# Patient Record
Sex: Female | Born: 1950 | Race: White | Hispanic: No | Marital: Married | Smoking: Former smoker
Health system: Southern US, Community
[De-identification: ages and names within clinical notes are randomized; demographics above are authoritative.]

## PROBLEM LIST (undated history)

## (undated) DIAGNOSIS — M549 Dorsalgia, unspecified: Secondary | ICD-10-CM

## (undated) DIAGNOSIS — M5090 Cervical disc disorder, unspecified, unspecified cervical region: Secondary | ICD-10-CM

## (undated) DIAGNOSIS — E785 Hyperlipidemia, unspecified: Secondary | ICD-10-CM

## (undated) DIAGNOSIS — I509 Heart failure, unspecified: Secondary | ICD-10-CM

## (undated) DIAGNOSIS — K449 Diaphragmatic hernia without obstruction or gangrene: Secondary | ICD-10-CM

## (undated) DIAGNOSIS — K219 Gastro-esophageal reflux disease without esophagitis: Secondary | ICD-10-CM

## (undated) DIAGNOSIS — M810 Age-related osteoporosis without current pathological fracture: Secondary | ICD-10-CM

## (undated) DIAGNOSIS — C3490 Malignant neoplasm of unspecified part of unspecified bronchus or lung: Secondary | ICD-10-CM

## (undated) DIAGNOSIS — G35D Multiple sclerosis, unspecified: Secondary | ICD-10-CM

## (undated) DIAGNOSIS — C4491 Basal cell carcinoma of skin, unspecified: Secondary | ICD-10-CM

## (undated) DIAGNOSIS — G8929 Other chronic pain: Secondary | ICD-10-CM

## (undated) DIAGNOSIS — G35 Multiple sclerosis: Secondary | ICD-10-CM

## (undated) DIAGNOSIS — C801 Malignant (primary) neoplasm, unspecified: Secondary | ICD-10-CM

## (undated) DIAGNOSIS — I4891 Unspecified atrial fibrillation: Secondary | ICD-10-CM

## (undated) HISTORY — DX: Hyperlipidemia, unspecified: E78.5

## (undated) HISTORY — PX: TONSILLECTOMY: SUR1361

## (undated) HISTORY — DX: Gastro-esophageal reflux disease without esophagitis: K21.9

## (undated) HISTORY — DX: Cervical disc disorder, unspecified, unspecified cervical region: M50.90

## (undated) HISTORY — DX: Other chronic pain: G89.29

## (undated) HISTORY — DX: Multiple sclerosis, unspecified: G35.D

## (undated) HISTORY — DX: Malignant (primary) neoplasm, unspecified: C80.1

## (undated) HISTORY — DX: Malignant neoplasm of unspecified part of unspecified bronchus or lung: C34.90

## (undated) HISTORY — DX: Dorsalgia, unspecified: M54.9

## (undated) HISTORY — DX: Multiple sclerosis: G35

## (undated) HISTORY — DX: Basal cell carcinoma of skin, unspecified: C44.91

## (undated) HISTORY — DX: Age-related osteoporosis without current pathological fracture: M81.0

## (undated) HISTORY — DX: Diaphragmatic hernia without obstruction or gangrene: K44.9

## (undated) HISTORY — PX: BREAST ENHANCEMENT SURGERY: SHX7

---

## 1997-12-01 HISTORY — PX: OTHER SURGICAL HISTORY: SHX169

## 2002-10-06 ENCOUNTER — Encounter: Payer: Self-pay | Admitting: Family Medicine

## 2002-10-06 ENCOUNTER — Ambulatory Visit (HOSPITAL_COMMUNITY): Admission: RE | Admit: 2002-10-06 | Discharge: 2002-10-06 | Payer: Self-pay | Admitting: Family Medicine

## 2003-10-17 ENCOUNTER — Other Ambulatory Visit: Admission: RE | Admit: 2003-10-17 | Discharge: 2003-10-17 | Payer: Self-pay | Admitting: Dermatology

## 2003-11-06 ENCOUNTER — Ambulatory Visit (HOSPITAL_COMMUNITY): Admission: RE | Admit: 2003-11-06 | Discharge: 2003-11-06 | Payer: Self-pay | Admitting: Family Medicine

## 2004-07-01 ENCOUNTER — Encounter (HOSPITAL_COMMUNITY): Admission: RE | Admit: 2004-07-01 | Discharge: 2004-07-31 | Payer: Self-pay | Admitting: Orthopedic Surgery

## 2004-07-05 ENCOUNTER — Ambulatory Visit (HOSPITAL_COMMUNITY): Admission: RE | Admit: 2004-07-05 | Discharge: 2004-07-05 | Payer: Self-pay | Admitting: Orthopedic Surgery

## 2004-08-16 ENCOUNTER — Encounter (HOSPITAL_COMMUNITY): Admission: RE | Admit: 2004-08-16 | Discharge: 2004-08-30 | Payer: Self-pay | Admitting: Orthopedic Surgery

## 2005-10-09 ENCOUNTER — Ambulatory Visit (HOSPITAL_COMMUNITY): Admission: RE | Admit: 2005-10-09 | Discharge: 2005-10-09 | Payer: Self-pay | Admitting: Family Medicine

## 2005-10-30 ENCOUNTER — Ambulatory Visit (HOSPITAL_COMMUNITY): Admission: RE | Admit: 2005-10-30 | Discharge: 2005-10-30 | Payer: Self-pay | Admitting: Family Medicine

## 2006-12-01 DIAGNOSIS — C3490 Malignant neoplasm of unspecified part of unspecified bronchus or lung: Secondary | ICD-10-CM

## 2006-12-01 HISTORY — DX: Malignant neoplasm of unspecified part of unspecified bronchus or lung: C34.90

## 2006-12-11 ENCOUNTER — Ambulatory Visit (HOSPITAL_COMMUNITY): Admission: RE | Admit: 2006-12-11 | Discharge: 2006-12-11 | Payer: Self-pay | Admitting: Family Medicine

## 2006-12-21 ENCOUNTER — Ambulatory Visit (HOSPITAL_COMMUNITY): Admission: RE | Admit: 2006-12-21 | Discharge: 2006-12-21 | Payer: Self-pay | Admitting: *Deleted

## 2006-12-22 ENCOUNTER — Ambulatory Visit (HOSPITAL_COMMUNITY): Admission: RE | Admit: 2006-12-22 | Discharge: 2006-12-22 | Payer: Self-pay | Admitting: *Deleted

## 2007-09-15 ENCOUNTER — Ambulatory Visit (HOSPITAL_COMMUNITY): Admission: RE | Admit: 2007-09-15 | Discharge: 2007-09-15 | Payer: Self-pay | Admitting: Family Medicine

## 2007-09-29 ENCOUNTER — Ambulatory Visit (HOSPITAL_COMMUNITY): Admission: RE | Admit: 2007-09-29 | Discharge: 2007-09-29 | Payer: Self-pay | Admitting: Family Medicine

## 2007-10-12 ENCOUNTER — Ambulatory Visit: Payer: Self-pay | Admitting: Thoracic Surgery

## 2007-10-18 ENCOUNTER — Ambulatory Visit (HOSPITAL_COMMUNITY): Admission: RE | Admit: 2007-10-18 | Discharge: 2007-10-18 | Payer: Self-pay | Admitting: Thoracic Surgery

## 2007-10-20 ENCOUNTER — Ambulatory Visit: Payer: Self-pay | Admitting: Thoracic Surgery

## 2007-11-01 DIAGNOSIS — C801 Malignant (primary) neoplasm, unspecified: Secondary | ICD-10-CM

## 2007-11-01 HISTORY — PX: LUNG REMOVAL, PARTIAL: SHX233

## 2007-11-01 HISTORY — DX: Malignant (primary) neoplasm, unspecified: C80.1

## 2007-11-08 ENCOUNTER — Ambulatory Visit: Payer: Self-pay | Admitting: Thoracic Surgery

## 2007-11-08 ENCOUNTER — Encounter: Payer: Self-pay | Admitting: Thoracic Surgery

## 2007-11-08 ENCOUNTER — Inpatient Hospital Stay (HOSPITAL_COMMUNITY): Admission: RE | Admit: 2007-11-08 | Discharge: 2007-11-12 | Payer: Self-pay | Admitting: Thoracic Surgery

## 2007-11-18 ENCOUNTER — Ambulatory Visit: Payer: Self-pay | Admitting: Thoracic Surgery

## 2007-11-18 ENCOUNTER — Encounter: Admission: RE | Admit: 2007-11-18 | Discharge: 2007-11-18 | Payer: Self-pay | Admitting: Thoracic Surgery

## 2007-12-08 ENCOUNTER — Ambulatory Visit: Payer: Self-pay | Admitting: Thoracic Surgery

## 2007-12-08 ENCOUNTER — Encounter: Admission: RE | Admit: 2007-12-08 | Discharge: 2007-12-08 | Payer: Self-pay | Admitting: Thoracic Surgery

## 2007-12-22 ENCOUNTER — Encounter: Admission: RE | Admit: 2007-12-22 | Discharge: 2007-12-22 | Payer: Self-pay | Admitting: Thoracic Surgery

## 2007-12-22 ENCOUNTER — Ambulatory Visit: Payer: Self-pay | Admitting: Thoracic Surgery

## 2008-01-20 ENCOUNTER — Ambulatory Visit: Payer: Self-pay | Admitting: Thoracic Surgery

## 2008-01-20 ENCOUNTER — Encounter: Admission: RE | Admit: 2008-01-20 | Discharge: 2008-01-20 | Payer: Self-pay | Admitting: Thoracic Surgery

## 2008-04-04 ENCOUNTER — Ambulatory Visit (HOSPITAL_COMMUNITY): Payer: Self-pay | Admitting: Oncology

## 2008-04-11 ENCOUNTER — Ambulatory Visit: Payer: Self-pay | Admitting: Thoracic Surgery

## 2008-04-11 ENCOUNTER — Encounter: Admission: RE | Admit: 2008-04-11 | Discharge: 2008-04-11 | Payer: Self-pay | Admitting: Thoracic Surgery

## 2008-05-30 ENCOUNTER — Encounter (HOSPITAL_COMMUNITY): Admission: RE | Admit: 2008-05-30 | Discharge: 2008-06-29 | Payer: Self-pay | Admitting: Oncology

## 2008-06-14 ENCOUNTER — Ambulatory Visit: Payer: Self-pay | Admitting: Thoracic Surgery

## 2008-12-01 HISTORY — PX: COLONOSCOPY: SHX174

## 2008-12-21 ENCOUNTER — Ambulatory Visit: Payer: Self-pay | Admitting: Gastroenterology

## 2008-12-26 ENCOUNTER — Ambulatory Visit (HOSPITAL_COMMUNITY): Admission: RE | Admit: 2008-12-26 | Discharge: 2008-12-26 | Payer: Self-pay | Admitting: Gastroenterology

## 2008-12-26 ENCOUNTER — Encounter: Payer: Self-pay | Admitting: Gastroenterology

## 2008-12-26 ENCOUNTER — Ambulatory Visit: Payer: Self-pay | Admitting: Gastroenterology

## 2009-02-13 ENCOUNTER — Encounter: Admission: RE | Admit: 2009-02-13 | Discharge: 2009-02-13 | Payer: Self-pay | Admitting: Thoracic Surgery

## 2009-02-13 ENCOUNTER — Ambulatory Visit: Payer: Self-pay | Admitting: Thoracic Surgery

## 2009-03-08 DIAGNOSIS — Z9089 Acquired absence of other organs: Secondary | ICD-10-CM | POA: Insufficient documentation

## 2009-03-08 DIAGNOSIS — G8929 Other chronic pain: Secondary | ICD-10-CM | POA: Insufficient documentation

## 2009-03-08 DIAGNOSIS — N809 Endometriosis, unspecified: Secondary | ICD-10-CM | POA: Insufficient documentation

## 2009-03-08 DIAGNOSIS — M549 Dorsalgia, unspecified: Secondary | ICD-10-CM

## 2009-03-08 DIAGNOSIS — Z978 Presence of other specified devices: Secondary | ICD-10-CM

## 2009-03-08 DIAGNOSIS — G35 Multiple sclerosis: Secondary | ICD-10-CM

## 2009-03-09 DIAGNOSIS — E785 Hyperlipidemia, unspecified: Secondary | ICD-10-CM | POA: Insufficient documentation

## 2009-03-09 DIAGNOSIS — E7800 Pure hypercholesterolemia, unspecified: Secondary | ICD-10-CM | POA: Insufficient documentation

## 2009-03-09 DIAGNOSIS — K649 Unspecified hemorrhoids: Secondary | ICD-10-CM | POA: Insufficient documentation

## 2009-03-09 DIAGNOSIS — K625 Hemorrhage of anus and rectum: Secondary | ICD-10-CM | POA: Insufficient documentation

## 2009-03-09 DIAGNOSIS — L290 Pruritus ani: Secondary | ICD-10-CM | POA: Insufficient documentation

## 2009-09-23 IMAGING — CT CT CHEST W/O CM
1 of 2 series · 15 of 30 positions shown, 19 images · IV contrast (agent unspecified)
Comparison: none

CLINICAL DATA: Abnormal chest x-ray on 09/15/07, raising the question of 2 nodular densities of the right upper lung zone.
CHEST CT WITHOUT CONTRAST:
TECHNIQUE: Multidetector CT imaging of the chest was performed following the standard protocol without IV contrast.

[Series 2: chestroutine 5.0 b40f · axial · 0.66mm/px · z∈[-490,-240]mm · 15 of 56 slices shown, 19 images]
[im 3/56  mediastinal]
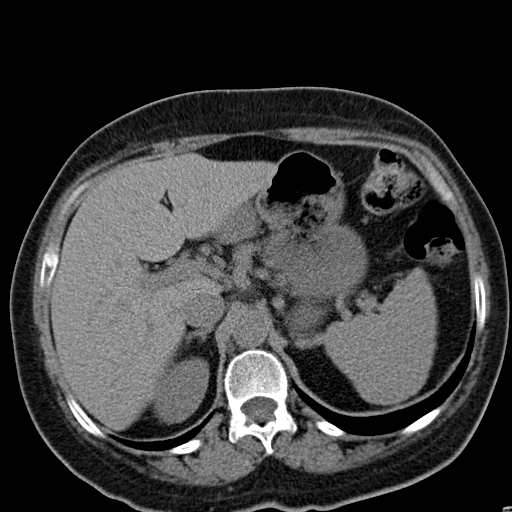
[im 3/56  lung]
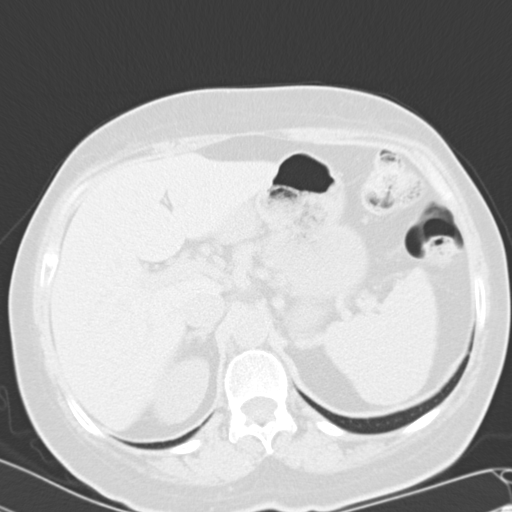
[im 8/56  lung]
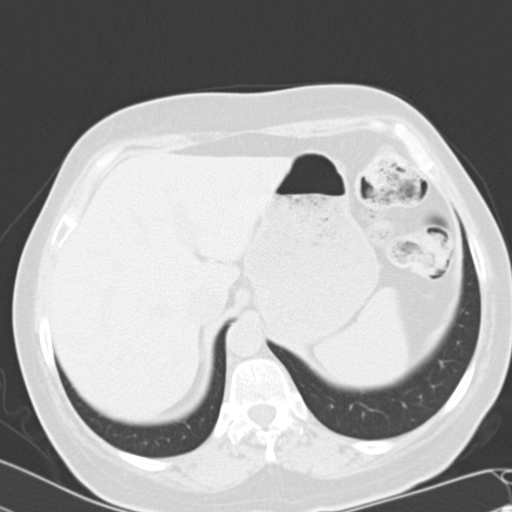
[im 11/56  lung]
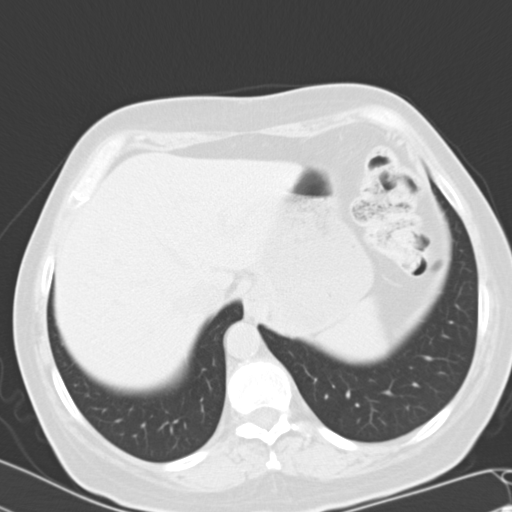
[im 16/56  lung]
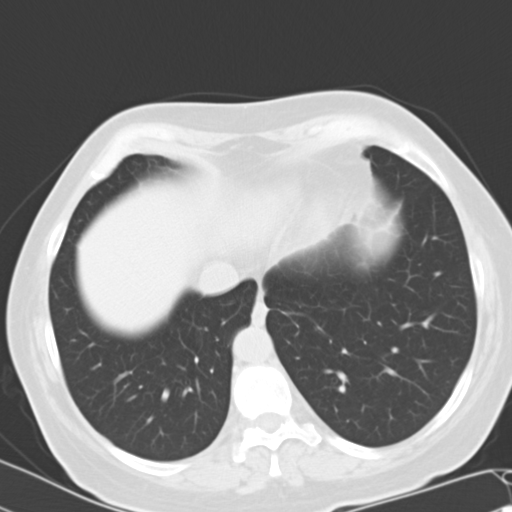
[im 19/56  mediastinal]
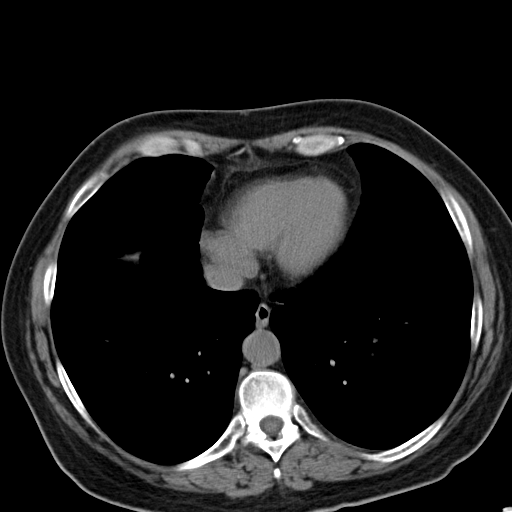
[im 19/56  lung]
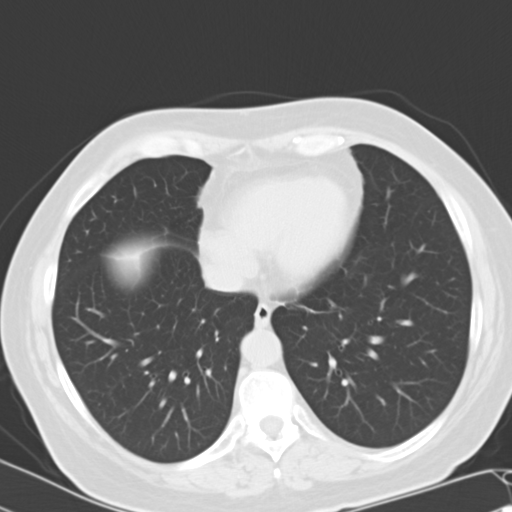
[im 23/56  lung]
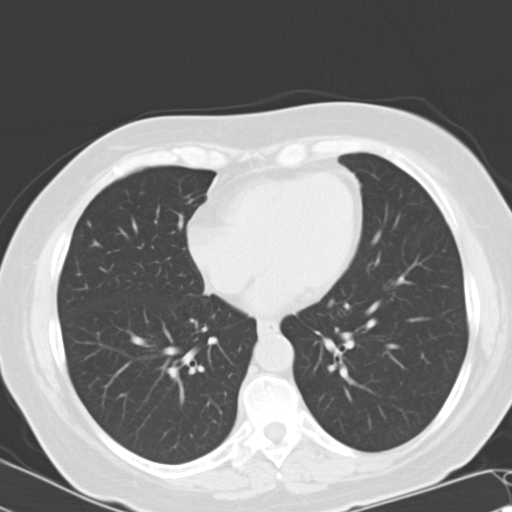
[im 24/56  lung]
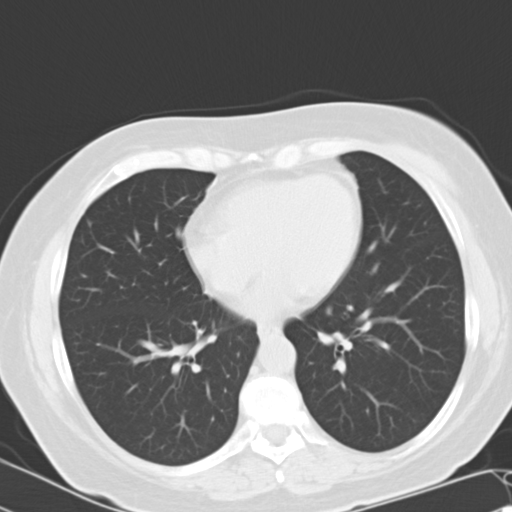
[im 28/56  lung]
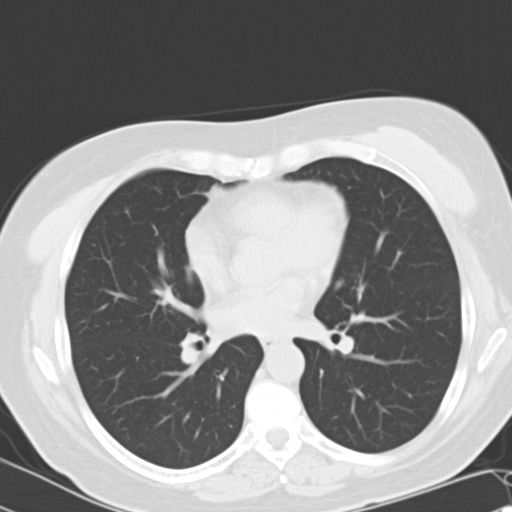
[im 29/56  mediastinal]
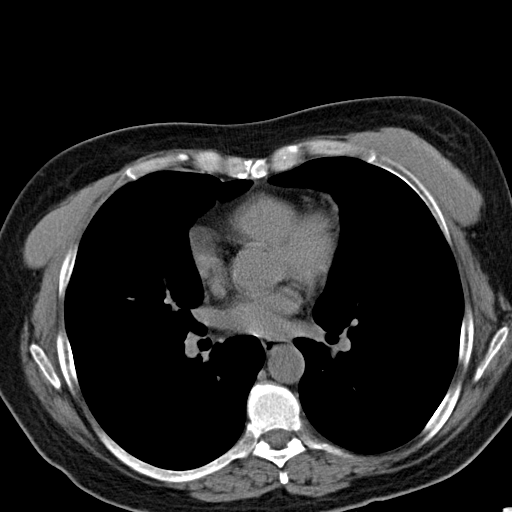
[im 29/56  lung]
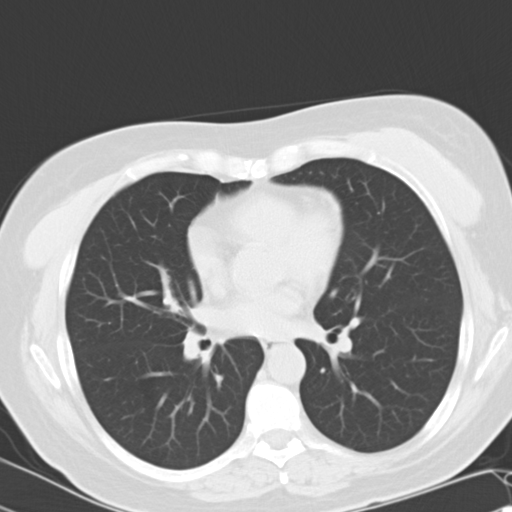
[im 32/56  lung]
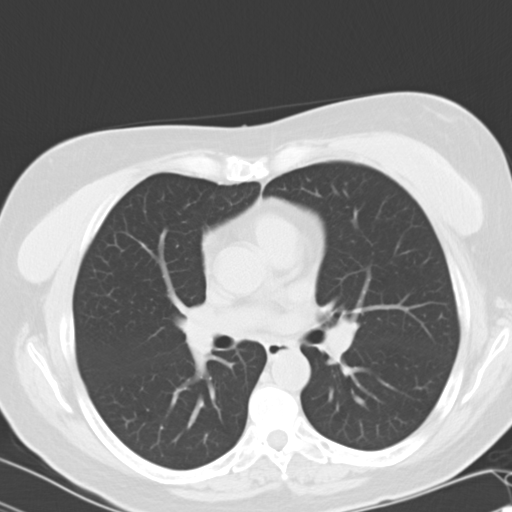
[im 37/56  lung]
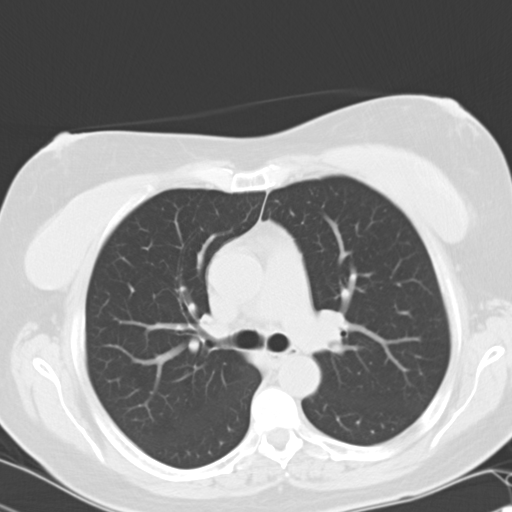
[im 40/56  lung]
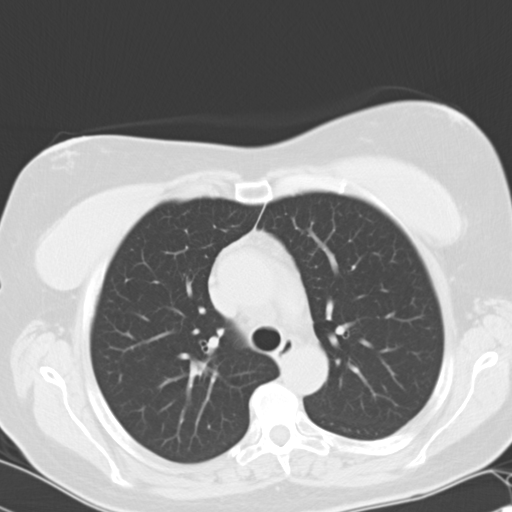
[im 45/56  mediastinal]
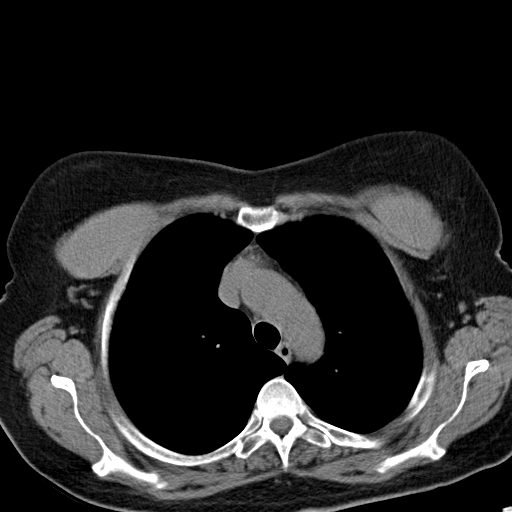
[im 45/56  lung]
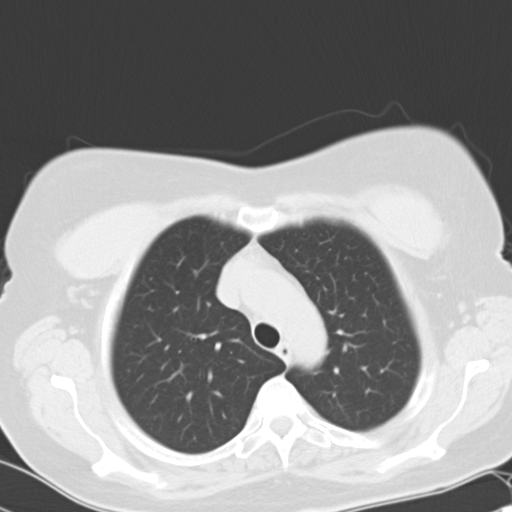
[im 48/56  lung]
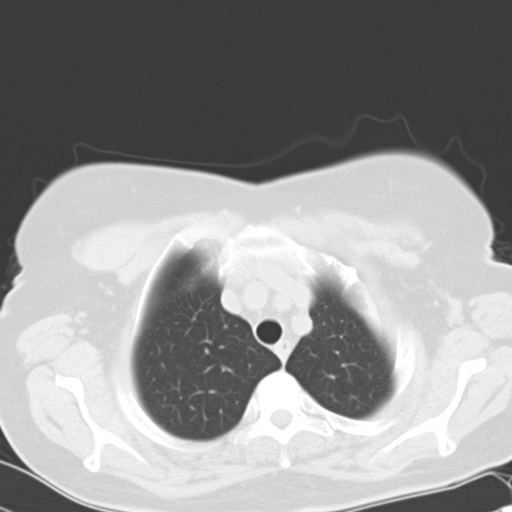
[im 53/56  lung]
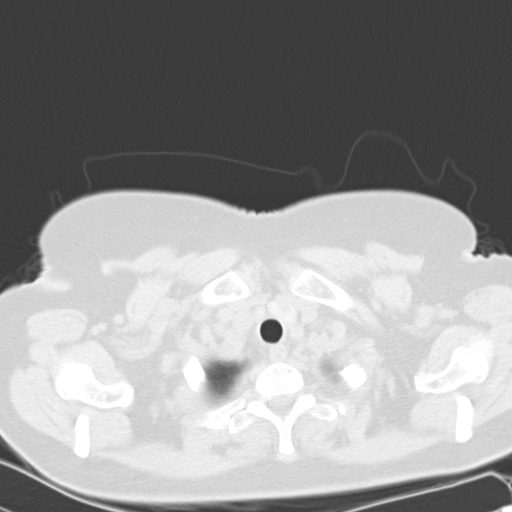

[15 of 30 positions shown; findings below may reference images not displayed]

FINDINGS: No evidence of an abnormal nodular density in the right upper lung zone as questioned on the chest x-ray. There is linear scarring in the anterior and medial aspect of the left lower lobe.  Area of focal concern is the presence of an ill-defined nodular density in the posterior subpleural aspect of the left upper lobe (see transaxial image #22 and coronal image #70). In part it has somewhat of a ground glass-like attenuation with a small central soft tissue nodular-like component. The margins of the density are spiculated.  I am concerned that this may represent a primary lung carcinoma. It measures 1.3 cm in diameter. No pathologically enlarged lymph nodes. Bilateral breast implants. There are some shotty-like nodes in the axillae. These nodes are not pathologically enlarged.
IMPRESSION: No evidence of a right upper lung zone nodule as questioned on the 09/15/07 chest x-ray, however, note that here is a nodule in the left upper lobe which is suspicious for primary lung carcinoma. Bronchoalveolar cell carcinoma would be a consideration. Consider thoracic surgical consultation. I called the report to Dr. Maiestro Silevta.

## 2009-09-26 ENCOUNTER — Ambulatory Visit (HOSPITAL_COMMUNITY): Payer: Self-pay | Admitting: Psychiatry

## 2009-10-03 ENCOUNTER — Ambulatory Visit (HOSPITAL_COMMUNITY): Payer: Self-pay | Admitting: Psychiatry

## 2010-02-06 ENCOUNTER — Encounter: Admission: RE | Admit: 2010-02-06 | Discharge: 2010-02-06 | Payer: Self-pay | Admitting: Thoracic Surgery

## 2010-02-06 ENCOUNTER — Ambulatory Visit: Payer: Self-pay | Admitting: Thoracic Surgery

## 2010-02-26 ENCOUNTER — Ambulatory Visit: Payer: Self-pay | Admitting: Gastroenterology

## 2010-02-26 DIAGNOSIS — R109 Unspecified abdominal pain: Secondary | ICD-10-CM | POA: Insufficient documentation

## 2010-03-27 ENCOUNTER — Encounter: Payer: Self-pay | Admitting: Gastroenterology

## 2010-03-28 ENCOUNTER — Ambulatory Visit (HOSPITAL_COMMUNITY): Admission: RE | Admit: 2010-03-28 | Discharge: 2010-03-28 | Payer: Self-pay | Admitting: Gastroenterology

## 2010-03-29 ENCOUNTER — Encounter (INDEPENDENT_AMBULATORY_CARE_PROVIDER_SITE_OTHER): Payer: Self-pay

## 2010-03-29 ENCOUNTER — Encounter: Payer: Self-pay | Admitting: Gastroenterology

## 2010-03-29 DIAGNOSIS — D696 Thrombocytopenia, unspecified: Secondary | ICD-10-CM | POA: Insufficient documentation

## 2010-03-29 LAB — CONVERTED CEMR LAB
AST: 17 units/L (ref 0–37)
BUN: 16 mg/dL (ref 6–23)
CO2: 25 meq/L (ref 19–32)
Calcium: 9.3 mg/dL (ref 8.4–10.5)
Chloride: 108 meq/L (ref 96–112)
Creatinine, Ser: 0.71 mg/dL (ref 0.40–1.20)
Eosinophils Absolute: 0.1 10*3/uL (ref 0.0–0.7)
Eosinophils Relative: 1 % (ref 0–5)
Glucose, Bld: 86 mg/dL (ref 70–99)
HCT: 39.8 % (ref 36.0–46.0)
Lipase: 21 units/L (ref 0–75)
Lymphs Abs: 2.3 10*3/uL (ref 0.7–4.0)
MCV: 91.1 fL (ref 78.0–100.0)
Platelets: 138 10*3/uL — ABNORMAL LOW (ref 150–400)
WBC: 5.1 10*3/uL (ref 4.0–10.5)

## 2010-12-20 ENCOUNTER — Encounter (HOSPITAL_COMMUNITY)
Admission: RE | Admit: 2010-12-20 | Discharge: 2010-12-31 | Payer: Self-pay | Source: Home / Self Care | Attending: Oncology | Admitting: Oncology

## 2010-12-20 ENCOUNTER — Ambulatory Visit (HOSPITAL_COMMUNITY)
Admission: RE | Admit: 2010-12-20 | Discharge: 2010-12-31 | Payer: Self-pay | Source: Home / Self Care | Attending: Oncology | Admitting: Oncology

## 2010-12-21 ENCOUNTER — Encounter: Payer: Self-pay | Admitting: Family Medicine

## 2010-12-22 ENCOUNTER — Encounter: Payer: Self-pay | Admitting: Family Medicine

## 2010-12-22 ENCOUNTER — Encounter (HOSPITAL_COMMUNITY): Payer: Self-pay | Admitting: Oncology

## 2010-12-22 ENCOUNTER — Encounter: Payer: Self-pay | Admitting: Thoracic Surgery

## 2010-12-24 ENCOUNTER — Ambulatory Visit (HOSPITAL_COMMUNITY)
Admission: RE | Admit: 2010-12-24 | Discharge: 2010-12-24 | Payer: Self-pay | Source: Home / Self Care | Attending: Family Medicine | Admitting: Family Medicine

## 2010-12-31 NOTE — Letter (Signed)
Summary: CT SCAN ABD/PELVIS ORDER  CT SCAN ABD/PELVIS ORDER   Imported By: Ave Filter 02/26/2010 14:36:23  _____________________________________________________________________  External Attachment:    Type:   Image     Comment:   External Document

## 2010-12-31 NOTE — Letter (Signed)
Summary: Recall, Labs Needed  [facility]  [address]   [place], [place] [address]   Phone: [phone]  Fax: [phone]    March 29, 2010  [name] [address] [place], [place]  [address] [birth year]   Dear Ms. Reginia Forts,   Our records indicate it is time to repeat your blood work.  You can take the enclosed form to the lab on or near the date indicated.  Please make note of the new location of the lab:   [address], 2nd floor   [facility]  Our office will call you within a week to ten business days with the results.  If you do not hear from [place] in 10 business days, you should call the office.  If you have any questions regarding this, call the office at [phone], and ask for the nurse.  Labs are due on 04/26/10.   Sincerely,    Hendricks Limes LPN  [facility] Ph: [phone]   Fax: [phone]

## 2010-12-31 NOTE — Miscellaneous (Signed)
Summary: Orders Update  Clinical Lists Changes  Problems: Added new problem of UNSPECIFIED THROMBOCYTOPENIA (ICD-287.5) Orders: Added new Test order of T-CBC w/Diff [id number]) - Signed
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
## 2011-04-15 NOTE — Discharge Summary (Signed)
[name], [name]                ACCOUNT NO.:  [id number]   MEDICAL RECORD NO.:  [id number]          PATIENT TYPE:  INP   LOCATION:  2027                         FACILITY:  [facility]   PHYSICIAN:  Ines Bloomer, M.D. DATE OF BIRTH:  13-May-1951   DATE OF ADMISSION:  11/08/2007  DATE OF DISCHARGE:  11/12/2007                               DISCHARGE SUMMARY   HISTORY OF THE PRESENT ILLNESS:  The patient is a 60 year old female  referred to Dr. Edwyna Shell in thoracic surgery consultation due to a left  lower lobe mass.  The patient is currently a nonsmoker but does have a  previous history, having quit approximately 10 years ago.  She also has  a known history of multiple sclerosis.  A recent chest x-ray showed  questionable left and questionable right upper lobe lesions.  She had a  CT scan which revealed no right upper lobe lesion but did show a left  lower lobe lesion that was approximately 3 cm and had appearance  suspicious for carcinoma.  She underwent a PET scan which showed some  slight uptake in the left lower lobe lesion, again pointing towards a  bronchoalveolar cancer diagnosis.  She was admitted this hospitalization  for resection.   MEDICATIONS PRIOR TO ADMISSION:  Included __________ once daily, she  also takes multiple herbs as well as calcium and Vitamin D supplements.   PAST MEDICAL HISTORY:  1. Multiple sclerosis.  2. Chronic gastroesophageal reflux.  3. Hypercholesterolemia.  4. History of endometriosis.  5. History of chronic back pain.   PAST SURGICAL HISTORY:  1. Surgery for endometriosis.  2. Tonsillectomy.  3. Hysterectomy.  4. Left oophorectomy.  5. Hiatal hernia repair.  6. Breast implants.   ALLERGIES:  She is allergic to certain types of antibiotics, although  she was uncertain of the name.  Reportedly, they caused GI intolerance.  She also had significant intolerance to nonsteroidal anti-  inflammatories.   FAMILY HISTORY/SOCIAL HISTORY/REVIEW OF  SYMPTOMS AND PHYSICAL EXAM:  Please see the History & Physical done at the time of admission.   HOSPITAL COURSE:  The patient was admitted electively and on November 08, 2007, she was taken to the operating room, at which time she underwent  the following procedure left lower lobe superior segmentectomy.  This  procedure was performed by Jovita Gamma, MD, tolerated well and she was  taken to the Post Anesthesia Care Unit in stable condition.   POSTOPERATIVE HOSPITAL COURSE:  She has overall done well.  She has  remained hemodynamically stable.  Her laboratory values do reveal a mild  anemia.  Most recent hemoglobin and hematocrit dated November 10, 2007,  are 10.7 and 30.9 respectively.  Electrolytes, BUN and creatinine are  within normal limits.  Oxygen has been weaned and she remains adequate  saturations on room air.  Her incisions are all healing well.  She has  tolerated activity increase.  She has remained afebrile.  Her overall  status is felt to be stable for discharge on today's date, November 12, 2007.   PATHOLOGY:  Pathology has revealed  the left lower lobe superior segment  was positive for adenocarcinoma, bronchoalveolar type, nonmucinous with  sclerosis, 0.8 cm.  The tumor extended but did not transect the visceral  pleura.  The nearest parenchymal and bronchial resection margins were  negative for tumor.  She also noted mild emphysematous changes.  For  further details, please see the pathology report.   MEDICATIONS ON DISCHARGE:  Patient will resume her preoperative  medications initially for pain Tylox 1-2 q.4-6h. as needed.   INSTRUCTIONS:  The patient received written instructions in regard to  medications, activity, diet, wound care and followup.   FOLLOWUP:  Dr. Edwyna Shell in 1 week, November 17, 2007, at 1:30, with a  repeat chest x-ray.   FINAL DIAGNOSIS:  Lung carcinoma of the adenocarcinoma, bronchoalveolar  type.  As described above.   OTHER DIAGNOSES:   As previously listed per the history.      Rowe Clack, P.A.-C.      Ines Bloomer, M.D.  Electronically Signed    WEG/MEDQ  D:  11/12/2007  T:  11/12/2007  Job:  [id number]

## 2011-04-15 NOTE — Assessment & Plan Note (Signed)
OFFICE VISIT   Lippe, [name]  DOB:  [birth year]                                        December 22, 2007  CHART #:  [id number]   Ms. [name] returned today and her chest x-ray is improved.  There is  still a lot of reaction there.  Her blood pressure was 127/70, pulse 79,  respirations 18, sats were 98%.  We gave her a refill for hydrocodone  and put her on Celebrex because of continued pain.  I will plan to see  her back again in 4 weeks with a chest x-ray.   Ines Bloomer, M.D.  Electronically Signed   DPB/MEDQ  D:  12/22/2007  T:  12/22/2007  Job:  [id number]

## 2011-04-15 NOTE — Op Note (Signed)
NAME:  [name], [name]                ACCOUNT NO.:  [id number]   MEDICAL RECORD NO.:  [id number]          PATIENT TYPE:  AMB   LOCATION:  DAY                           FACILITY:  [facility]   PHYSICIAN:  Kassie Mends, M.D.      DATE OF BIRTH:  15-Mar-1951   DATE OF PROCEDURE:  12/26/2008  DATE OF DISCHARGE:                               OPERATIVE REPORT   PROCEDURE:  Ileocolonoscopy with cold forceps biopsy, rectal nodules.   INDICATION FOR EXAM:  [name] is a 60 year old female, who had her  last colonoscopy in 2001.  At that time, she was complaining of  intermittent left lower quadrant pain.  It was felt that possibly was  secondary to endometriosis.  The colonoscopy showed no evidence of  hemorrhoids.  She was asked to follow up with her OB/GYN.  She presented  to the office with intermittent rectal bleeding and rectal itching.  She  has also have some burning in her rectum.  She has gained weight.  She  complains of intermittent left lower quadrant pain.  She was noted to  have had surgery for her endometriosis.   FINDINGS:  1. Slightly tortuous sigmoid colon.  The scope was able to be advanced      easily through the sigmoid colon.  No evidence of polyps, masses,      inflammatory changes, diverticula, or AVMs.  2. Multiple hemorrhagic nodules seen in the proximal rectum.  Biopsies      obtained via cold forceps.  3. Normal distal terminal ileum, approximately 5 cm visualized.  4. Internal hemorrhoids.  Otherwise normal retroflexed view of the      rectum.   DIAGNOSES:  1. Intermittent rectal bleeding, rectal burning, and anal pruritus      likely secondary to hemorrhoids.  2. Hemorrhagic nodules likely secondary to prep artifact or      medications.   RECOMMENDATIONS:  1. No aspirin or NSAIDs for 14 days.  No anticoagulation for 7 days.      We will call Ms. Hosea with the results of her biopsies.  2. Screening colonoscopy in 10 years.  3. She should add Anusol-HC 1 per rectum  every 12 hours to 14 days.      She was given a handout on a high-fiber diet and hemorrhoids.  4. If her abdominal pain persists, could consider adding a tricyclic      antidepressant or referral back to her OB/GYN to consider      management of endometriosis.  5. Follow up in 6 weeks with Lorenza Burton for abdominal pain or      rectal itching.   MEDICATIONS:  1. Demerol 100 mg IV.  2. Versed 5 mg IV.   PROCEDURE TECHNIQUE:  Physical exam was performed.  Informed consent was  obtained from the patient after explaining the benefits, risks, and  alternatives to the procedure.  The patient was connected to the monitor  and placed in the left lateral position.  Continuous oxygen was provided  by nasal cannula, and IV medicine was administered through an indwelling  cannula.  After administration of sedation and rectal exam, the  patient's rectum was intubated and the scope was advanced under direct  visualization to the distal terminal ileum.  The scope was removed  slowly by carefully examining the color, texture, anatomy, and integrity  of the mucosa on the way out.  The patient was recovered in endoscopy  and discharged home in satisfactory condition.   PATH:  Simple adenoma. Prep artifact.      Kassie Mends, M.D.  Electronically Signed     SM/MEDQ  D:  12/26/2008  T:  12/26/2008  Job:  [id number]   cc:   Donna Bernard, M.D.  Fax: [phone]

## 2011-04-15 NOTE — Letter (Signed)
February 06, 2010   Santina Evans A. Orlin Hilding, MD  [address]  [address]  [place], [place] [address]   Re:  [name], [name]                DOB:  [birth year]   Dear Dr. Orlin Hilding,   I saw the patient back today for a CT scan now over 2 years since we did  a left lower lobe superior segmentectomy.  Her CT scan shows no evidence  of recurrence of her cancer.  She still having a lot of pain,  postthoracotomy pain.  Apparently, he has been tried on Celebrex,  Neurontin, Cymbalta, which have had no effect.  I told her that  unfortunately, if she continues to have pain after 2 years, she will  probably was to continue to have pain from now on, but hopefully we will  decrease it in the future.  There is really not much else that we can  add to this.  She does have chronic back pain.  Her blood pressure was  137/74, pulse 60, respirations 18, sats were 98%.  We will see her in 6  months with another chest x-ray and then a CT scan in 1 year.   Ines Bloomer, M.D.  Electronically Signed   DPB/MEDQ  D:  02/06/2010  T:  02/07/2010  Job:  [id number]

## 2011-04-15 NOTE — Letter (Signed)
November 18, 2007   Scott A. Gerda Diss, MD  [address]  [place], [place] [address]   Re:  FERN, CANOVA                DOB:  [birth year]   Dear Lorin Picket:   I saw Ms. Grondahl in the office today after she had a left lower lobe  superior segmentectomy for bronchoalveolar cancer.  She is doing well  overall.  I gave her a refill for Percocet.  I told her she could  gradually increase her activity and we removed her chest tube sutures.  Plan to see her back again in 3 weeks with a chest x-ray and at that  time I will refer to Dr. Mariel Sleet for followup.   Ines Bloomer, M.D.  Electronically Signed   DPB/MEDQ  D:  11/18/2007  T:  11/19/2007  Job:  [id number]

## 2011-04-15 NOTE — Letter (Signed)
October 20, 2007   Scott A. 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  I discussed this with her, her  daughter and recommended she have a wedge resection of this lesion and  when possible a left lower lobe superior segmentectomy.  She is  agreeable to this.  We have tentatively set this up for December 8th at  [facility].  Her blood pressure was 143/78, pulse 78, respirations  18, sats were 98%.   Ines Bloomer, M.D.  Electronically Signed   DPB/MEDQ  D:  10/20/2007  T:  10/21/2007  Job:  [id number]

## 2011-04-15 NOTE — Letter (Signed)
February 13, 2009   Scott A. Gerda Diss, MD  [address], [address]  [place], [place] [address]   Re:  [name], [name]                DOB:  1951/06/14   Dear Dr. Gerda Diss:   I saw the patient back today where he is now about 15 months since we  did a left lower lobe superior segmentectomy.  She is doing well  overall.  She is still having some moderate post thoracotomy pain.  Lungs are clear to auscultation and percussion.  CT scan showed no  evidence of recurrence.  I will plan to see her back again in 6 months  with a chest x-ray.   Ines Bloomer, M.D.  Electronically Signed   DPB/MEDQ  D:  02/13/2009  T:  02/13/2009  Job:  [id number]   cc:   Ladona Horns. Mariel Sleet, MD

## 2011-04-15 NOTE — Assessment & Plan Note (Signed)
OFFICE VISIT   Stahly, Merleen T  DOB:  [birth year]                                        Apr 11, 2008  CHART #:  [id number]   She came for followup today.  Her blood pressure is 110/60, pulse 61,  respirations 18, saturations were 96%.  I gave her a refill for  hydrocodone  750-500.  She is doing well overall.  Chest x-ray showed normal  postoperative changes.  I plan to see back again in July after her CT  scan.   Ines Bloomer, M.D.  Electronically Signed   DPB/MEDQ  D:  04/11/2008  T:  04/11/2008  Job:  [id number]

## 2011-04-15 NOTE — Letter (Signed)
October 12, 2007   Scott A. Gerda Diss, MD  [address], [address]  [place]  [place] [address]   Re:  [name], [name]                DOB:  07-05-1951   Dear Lorin Picket:   I saw the patient in the office today.  This 60 year old patient is a  nonsmoker.  She quit smoking 10 years ago and just smoked less than half  a pack a day.  She has recently been diagnosed with multiple sclerosis.  She had a chest x-ray and there was some question of some left upper  lobe lesions and underwent a CT scan which showed that there were no  right upper lobe lesions but showed a left lower lobe lesion that was 3  cm that was questionable bronchoalveolar.  Her pulmonary function tests  showed an FVC of 3.72 with an FEV1 of 2.98.  She has had no hemoptysis,  fever, chills, excessive sputum.  Referred here for evaluation.   MEDICATIONS:  1. Copaxone 1 daily.  2. Multiple herbs and calciums and vitamins.   She has been treated for chronic reflux, hypercholesterolemia, severe  endometriosis, chronic back pain, and multiple sclerosis.  She has had a  previous surgery for endometriosis and tonsillectomy and had a  hysterectomy, as well as a left oophorectomy, a hiatal hernia.  She has  also had breast implants.   FAMILY HISTORY:  Negative for vascular disease but very positive for  cancer.  She has had lung cancer, ovarian cancer, renal cell cancer in  her family.  There is a very strong family history of cancer.   SOCIAL HISTORY:  She is married, has 1 child.  She quit smoking 10 years  ago.  Does not drink alcohol on a regular basis.   REVIEW OF SYSTEMS:  GENERAL:  weight is stable. weight 180.  She is 5  feet 6 inches.  CARDIAC:  No angina or atrial fibrillation.  PULMONARY:  See history of present illness.  GI:  No nausea, vomiting, constipation, or diarrhea.  GU:  No dysuria or frequent urination.  VASCULAR:  No claudication, DVT, TIAs.  NEUROLOGICAL:  She has some occasional dizziness.  No headaches or  seizures.  MUSCULOSKELETAL:  No joint pain or rash.  No psychiatric illnesses.  EYE/ENT:  No change in her hearing.  Recent decrease in her eyesight.  HEMATOLOGICAL:  Positive bleeding or clotting disorders.   PHYSICAL EXAM:  Her blood pressure is 139/67.  Pulse 59.  Respirations  18.  Sats are 94%.  Head, Eyes, Ears, Nose, and Throat:  Unremarkable.  Neck:  Supple without thyromegaly.  There is no supraclavicular or  axillary adenopathy.  Chest:  Clear to auscultation and percussion.  Heart:  Regular sinus rhythm.  No murmurs.  Abdomen:  Obese.  Bowel  sounds are normal.  There is no hepatosplenomegaly.  Pulses are 2+.  There is no clubbing or edema.  Neurologic:  She is oriented x3.  Sensory and motor intact.   IMPRESSION:  I feel that she has a left lower lobe lesion even though it  was read by the radiologist to be in the left upper lobe.  I feel it is  in the superior segment of the left lower lobe.  It has a ground-glass-  type appearance.  I went ahead and ordered a PET scan and then,  depending on the PET scan findings would recommend resection versus  observation.  I appreciate the opportunity of seeing the patient.   Ines Bloomer, M.D.  Electronically Signed   DPB/MEDQ  D:  10/12/2007  T:  10/13/2007  Job:  [id number]

## 2011-04-15 NOTE — Assessment & Plan Note (Signed)
OFFICE VISIT   West, Yolanda T  DOB:  [birth year]                                        January 20, 2008  CHART #:  [id number]   [name] came back today.  She is doing well after her surgery of left  lower lobe superior segmentectomy for bronchoalveolar cancer that is  0.8.  She is having less pain in her chest.  She has had further  improvement in aeration of the left lower lobe.  I will refer her to Dr.  Glenford Peers for followup and for opinion, but she probably will  require no further treatment.   Her blood pressure is 124/72, pulse 66, respirations 18.  Saturations  were 97%.  I gave her a refill for hydrocodone, and I will see her back  again in two months with a chest x-ray.   Ines Bloomer, M.D.  Electronically Signed   DPB/MEDQ  D:  01/20/2008  T:  01/21/2008  Job:  [id number]   cc:   Ladona Horns. Mariel Sleet, MD

## 2011-04-15 NOTE — Op Note (Signed)
NAMESHAWNTEL, FARNWORTH                ACCOUNT NO.:  [id number]   MEDICAL RECORD NO.:  [id number]          PATIENT TYPE:  INP   LOCATION:  2550                         FACILITY:  [facility]   PHYSICIAN:  Ines Bloomer, M.D. DATE OF BIRTH:  11/02/1951   DATE OF PROCEDURE:  11/08/2007  DATE OF DISCHARGE:                               OPERATIVE REPORT   PREOPERATIVE DIAGNOSIS:  Left lower lobe superior segmental lesion.   POSTOPERATIVE DIAGNOSIS:  Bronchoalveolar cancer left lower lobe.   OPERATION:  Left lower lobe superior segmentectomy.   SURGEON:  Ines Bloomer, M.D.   FIRST ASSISTANT:  Erskine Squibb, RNFA.   ANESTHESIA:  General   DESCRIPTION OF PROCEDURE:  After percutaneous insertion of all  monitoring lines, the patient underwent general anesthesia and was  prepped and draped in the usual sterile manner.  She was turned to the  left lateral thoracotomy position.  Dual-lumen tube was inserted and the  left lung was deflated.  Two trocar sites were made in the anterior and  posterior axillary line in the seventh intercostal.  Two trocars were  inserted.  A 30-degree scope was inserted, and the superior segment was  identified.  We could see the lesion, so we made a 5-7 cm incision over  the triangle of auscultation, partially dividing the latissimus dorsi.  and reflecting the serratus anteriorly.  We entered the sixth  intercostal space.  We palpated the lesion which was real small.  It was  0.9 mm on x-ray.  We decided to do a left lower lobe superior  segmentectomy.  Dissected out the superior pulmonary artery and then  looped with a vascular tape.   Then coming in through a trocar site stapled and divided with the auto  suture, 2 mm Roticulator stapler.  We then dissected out several 11L  and 10L nodes from around the bronchus.  We dissected up the superior  pulmonary vein, and ligated it proximally and distally with #0 silk.  Then the bronchus was dissected up, stapled and  divided with the auto  suture, a blue 3.2 mm stapler.  Finally the bronchus was resected with  an auto suture, green stapler, with 60 mm three applications.  This was  placed in an Endobag, and sent for frozen section as a bronchoalveolar  cancer with negative margins.  All bleeding was electrocoagulated.  FloSeal was applied to the staple line.  Two chest tubes were brought in  through the stab wounds, then tied, and placed with #0 silk.  A Marcaine  block was done in the usual fashion.  A single OnQue was inserted in the  usual fashion.  The patient was returned to the recovery room in stable  condition.      Ines Bloomer, M.D.  Electronically Signed    DPB/MEDQ  D:  11/08/2007  T:  11/08/2007  Job:  [id number]

## 2011-04-15 NOTE — H&P (Signed)
[name], [name]                ACCOUNT NO.:  [id number]   MEDICAL RECORD NO.:  [id number]          PATIENT TYPE:  INP   LOCATION:  NA                           FACILITY:  [facility]   PHYSICIAN:  [name], M.D. DATE OF BIRTH:  [birth year]   DATE OF ADMISSION:  DATE OF DISCHARGE:                              HISTORY & PHYSICAL   HISTORY OF CHIEF COMPLAINT:  Left lower lobe mass.   HISTORY OF PRESENT ILLNESS:  This 60 year old patient is a nonsmoker who  quit smoking 10 years ago, and smoked less than 1/2 pack a day.  She has  a history of  multiple sclerosis.  She has had to have a chest x-ray,  which showed questionable left questionable right upper lobe lesion.  She had a CT scan that showed no right upper lobe lesion, but showed a  left lower lobe lesion that was approximately 3 cm and was question of  possible alveolar cancer.  Pulmonary function studies showed FVC of  3.71, with FEV1 of 2.98.  She has had no hemoptysis, fever, chills, or  excessive sputum.  She underwent a PET scan, which showed some slight  uptake in the left lower lobe lesion, again pointing toward  bronchoalveolar cancer and she is being admitted for left lower lobe  superior segmentectomy.   MEDICATIONS:  Copaxone 1 a day, herbs.   PAST MEDICAL AND SURGICAL HISTORY:  She has been treated for chronic  reflux, hypercholesterolemia, severe endometriosis, chronic back pain  and multiple sclerosis.  She has had previous surgery for endometriosis.  She has had tonsillectomy, hysterectomy, left oophorectomy, and she also  has a hiatal hernia and breast implants.   ALLERGIES:  She gets GI upset with anti-inflammatories, and is allergic  to certain types of antibiotics, although she could not tell us which  antibiotics.   FAMILY HISTORY:  Negative cardiovascularly, but very positive for  cancer.  They have had lung cancer, ovarian cancer, renal cell cancer in  family.   SOCIAL HISTORY:  She is married,  has 1 child.  She quit smoking 10 years  ago, does not drink alcohol on a regular basis.   REVIEW OF SYSTEMS:  Weight is 180 pounds, she is 5 foot-6 inches.  CARDIAC:  No angina or atrial fibrillation.  PULMONARY:  See history of  present illness.  GI:  No nausea, vomiting, constipation, or diarrhea,  but she does have hiatal hernia and GERD.  GU:  No dysuria or frequent  urination.  VASCULAR:  No claudication, DVT, TIA.  NEUROLOGIC:  Occasional dizziness.  No headache, seizures, or blackouts.  MUSCULOSKELETAL:  No joint rash.  PSYCHIATRIC:  No depression or  nervousness.  EYES/ENT:  No change in her eyesight, no change in her  hearing, no recent decrease in her eyesight.  HEMATOLOGIC:  No problems  with bleeding, no clotting disorders or anemia.   PHYSICAL EXAMINATION:  VITAL SIGNS:  Blood pressure 139/67, pulse 59,  respirations 18, saturations 94%.  HEAD:  Atraumatic.  EYES:  Pupils are equal, reactive to light and accommodation.  Extraocular movements are  normal.  EARS:  Tympanic membranes are intact.  NOSE:  There is no septal deviation.  THROAT:  Without lesion.  NECK:  Supple, without thyromegaly.  There is no supraclavicular or  axillary adenopathy, no carotid bruits.  No jugular venous distention.  CHEST:  Clear to auscultation and percussion.  HEART:  Regular rate and rhythm, no murmurs.  ABDOMEN:  Obese.  Bowel sounds are normal.  There is no  hepatosplenomegaly.  EXTREMITIES:  Pulses 2+.  There is no clubbing or edema.  NEUROLOGIC:  She is oriented x3.  Motor intact.  Cranial nerves intact.   IMPRESSION:  1. Left lower lobe lesion.  2. Multiple sclerosis.  3. Hiatal hernia.   PLAN:  Left lower lobe superior segmentectomy.      [name], M.D.  Electronically Signed     DPB/MEDQ  D:  11/04/2007  T:  11/04/2007  Job:  [id number]

## 2011-04-15 NOTE — Assessment & Plan Note (Signed)
OFFICE VISIT   [name]  DOB:  [birth year]                                        June 14, 2008  CHART #:  [id number]   HISTORY OF PRESENT ILLNESS:  The patient is status post left lower lobe  superior segmentectomy and left thoracotomy done by Dr. Edwyna Shell on  November 08, 2007.  She presents today for a 32-month followup CT scan.  The patient still complains of some burning, cramping pain starting from  her incision site radiating down to her left breast.  She is still  taking 1-2 pain pills a day.  She denies any coughing, hemoptysis,  wheezing, or shortness of breath.  The patient is back to normal  activity.   PHYSICAL EXAMINATION:  VITAL SIGNS:  Blood pressure 134/73, pulse of 64,  respirations of 18, and O2 sat 98% on room air.  RESPIRATORY:  Positive rhonchi at left lower lobe.  CARDIAC:  Regular rate and rhythm.  SKIN:  Incisions are all healed well.   STUDIES:  The patient had chest CT scan done on May 30, 2008, showing  postsurgical change in left lower lobe.  Her soft tissue attenuation at  the surface line was felt to to represent granulation/scarring.  There  is no evidence of metastatic disease or local recurrence within the  chest.   IMPRESSION AND PLAN:  The patient is about 7 months postoperatively.  She is seen and evaluated by Dr. Edwyna Shell.  Dr. Edwyna Shell also evaluated the  patient's CT scan.  The patient is continuing to progress well.  Her  results of CT scan were discussed with the patient.  Due to her  persistent pain, she was given another prescription for a total 50  hydrocodone 7.5.  The patient is told that this will be the last  prescription for pain medication.  We will plan to follow up with the  patient in 6 months with a repeat CT scan of her chest.  The patient is  told if she has any surgical issues, she is to contact us.  She is in  agreement and understands.   Ines Bloomer, M.D.  Electronically Signed   KMD/MEDQ  D:  06/14/2008  West:  06/15/2008  Job:  [id number]

## 2011-04-18 NOTE — Letter (Signed)
December 08, 2007   Scott A. Gerda Diss, MD  [address]  [place]  [place] [address]   Re:  [name], [name]                DOB:  [birth year]   Dear Lorin Picket:   I saw [name] back in the office today. I gave her a refill for  Percocet #40. Her blood pressure is 130/74, pulse 80, respirations 18,  and saturations are 94%. Chest x-ray still showed some left lower lobe  atelectasis and effusion and I encouraged her to increase her activity.  She is still not taking good breaths and there was some decreased breath  sounds on this side. Her chest x-ray was clear, so from my standpoint,  that looks good. I will see her back again in 2 weeks, just to check on  another chest x-ray to be sure that things have improved.   I appreciate the opportunity of seeing [name].   Ines Bloomer, M.D.  Electronically Signed   DPB/MEDQ  D:  12/08/2007  T:  12/08/2007  Job:  [id number]

## 2011-04-18 NOTE — Procedures (Signed)
NAME:  NAHDIA, DOUCET                          ACCOUNT NO.:  [id number]   MEDICAL RECORD NO.:  [id number]                   PATIENT TYPE:  OUT   LOCATION:  RAD                                  FACILITY:  [facility]   PHYSICIAN:  Edward L. Juanetta Gosling, M.D.             DATE OF BIRTH:  11/18/1951   DATE OF PROCEDURE:  DATE OF DISCHARGE:                              PULMONARY FUNCTION TEST   FINDINGS:  Spirometry shows decreased flow between 25 and 75% of the forced  vital capacity.  This is sometimes associated with poor flow in the smaller  airways.  This is actually minimally reduced.  Otherwise, normal.      ___________________________________________                                            Oneal Deputy. Juanetta Gosling, M.D.   ELH/MEDQ  D:  11/06/2003  T:  11/07/2003  Job:  [id number]   cc:   Donna Bernard, M.D.  [address]  [place]  [place] [address]  Fax: [phone]

## 2011-07-09 ENCOUNTER — Other Ambulatory Visit: Payer: Self-pay | Admitting: Family Medicine

## 2011-07-09 DIAGNOSIS — C801 Malignant (primary) neoplasm, unspecified: Secondary | ICD-10-CM

## 2011-07-11 ENCOUNTER — Ambulatory Visit (HOSPITAL_COMMUNITY)
Admission: RE | Admit: 2011-07-11 | Discharge: 2011-07-11 | Disposition: A | Discharge status: Home / Self Care | Payer: Federal, State, Local not specified - PPO | Source: Ambulatory Visit | Attending: Family Medicine | Admitting: Family Medicine

## 2011-07-11 DIAGNOSIS — R0789 Other chest pain: Secondary | ICD-10-CM | POA: Insufficient documentation

## 2011-07-11 DIAGNOSIS — C801 Malignant (primary) neoplasm, unspecified: Secondary | ICD-10-CM

## 2011-07-11 DIAGNOSIS — Z85118 Personal history of other malignant neoplasm of bronchus and lung: Secondary | ICD-10-CM | POA: Insufficient documentation

## 2011-08-12 ENCOUNTER — Encounter: Payer: Self-pay | Admitting: Urgent Care

## 2011-08-12 ENCOUNTER — Ambulatory Visit (INDEPENDENT_AMBULATORY_CARE_PROVIDER_SITE_OTHER): Payer: Federal, State, Local not specified - PPO | Admitting: Urgent Care

## 2011-08-12 DIAGNOSIS — R109 Unspecified abdominal pain: Secondary | ICD-10-CM

## 2011-08-12 DIAGNOSIS — K219 Gastro-esophageal reflux disease without esophagitis: Secondary | ICD-10-CM

## 2011-08-12 DIAGNOSIS — K5900 Constipation, unspecified: Secondary | ICD-10-CM

## 2011-08-12 MED ORDER — OMEPRAZOLE 20 MG PO CPDR: 20.0000 mg | DELAYED_RELEASE_CAPSULE | Freq: Every day | ORAL | Status: DC

## 2011-08-12 NOTE — Patient Instructions (Signed)
I have sent OMEPRAZOLE prescription over to [facility]. Start omeprazole 20mg  daily 30 minutes before breakfast Continue metamucil daily Begin ALIGN over the counter supplement Go get your labs today I will call w/ CT report  Abdominal Pain Abdominal pain can be caused by many things. Your caregiver decides the seriousness of your pain by an examination and possibly blood tests and X-rays. Many cases can be observed and treated at home. Most abdominal pain is not caused by a disease and will probably improve without treatment. However, in many cases, more time must pass before a clear cause of the pain can be found. Before that point, it may not be known if you need more testing, or if hospitalization or surgery is needed. HOME CARE INSTRUCTIONS  Do not take laxatives unless directed by your caregiver.   Take pain medicine only as directed by your caregiver.   Only take over-the-counter or prescription medicines for pain, discomfort, or fever as directed by your caregiver.  Try a clear liquid diet (broth, tea, or water) for 1-2 days or as ordered by your caregiver. Slowly move to a bland diet as tolerated.  SEEK IMMEDIATE MEDICAL CARE IF:  The pain does not go away.   You or your child has an oral temperature above 101, not controlled by medicine.   You keep throwing up (vomiting).   The pain is felt only in portions of the abdomen. Pain in the right side could possibly be appendicitis. In an adult, pain in the left lower portion of the abdomen could be colitis or diverticulitis.   You pass bloody or black tarry stools.  MAKE SURE YOU:  Understand these instructions.   Will watch your condition.   Will get help right away if you are not doing well or get worse.  Document Released: 08/27/2005 Document Re-Released: 02/11/2010 [facility] Patient Information 2011 [place], [place].
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
## 2011-08-14 ENCOUNTER — Encounter: Payer: Self-pay | Admitting: Urgent Care

## 2011-08-14 DIAGNOSIS — K5900 Constipation, unspecified: Secondary | ICD-10-CM | POA: Insufficient documentation

## 2011-08-14 DIAGNOSIS — K219 Gastro-esophageal reflux disease without esophagitis: Secondary | ICD-10-CM | POA: Insufficient documentation

## 2011-08-14 NOTE — Assessment & Plan Note (Addendum)
[name] is a 60 y.o. caucasian female w/ chronic intermittent left-sided abd pain, worst pain is at LUQ.  CT a/p w/ IV/oral contrast r/o pancreatitis, diverticulitis, renal lithiasis.  IBS/constipation remains in differential.  CBC, lipase/amylase, CMP Continue metamucil daily Begin ALIGN over the counter supplement Go get your labs today I will call w/ CT report

## 2011-08-14 NOTE — Progress Notes (Signed)
Cc to PCP 

## 2011-08-14 NOTE — Assessment & Plan Note (Signed)
See "abdominal pain"

## 2011-08-14 NOTE — Assessment & Plan Note (Signed)
New onset.  Consider EGD pending CT results.  Begin omeprazole.  Start omeprazole 20mg  daily 30 minutes before breakfast

## 2011-08-18 ENCOUNTER — Ambulatory Visit (HOSPITAL_COMMUNITY): Admission: RE | Admit: 2011-08-18 | Payer: Federal, State, Local not specified - PPO | Source: Ambulatory Visit

## 2011-08-20 NOTE — Progress Notes (Signed)
Please call patient to see if she plans on getting lab work done and following through with CT scan. Thanks

## 2011-08-21 NOTE — Progress Notes (Signed)
Tried to call pt- NA, mailed letter to pt. 

## 2011-08-25 NOTE — Progress Notes (Signed)
NO LABS OR CT AS OF 08/25/2011. AGREE WITH LABS AND CT. PT NEEDS EGD FOR NEW-ONSET DYSPEPSIA IF NO OTHER SOURCE IDENTIFIED.

## 2011-08-28 NOTE — Progress Notes (Signed)
No response from pt letter or phone calls.  No labs or CT as of date.

## 2011-09-08 ENCOUNTER — Other Ambulatory Visit: Payer: Self-pay | Admitting: Family Medicine

## 2011-09-08 DIAGNOSIS — M8580 Other specified disorders of bone density and structure, unspecified site: Secondary | ICD-10-CM

## 2011-09-08 LAB — CBC
HCT: 30.9 — ABNORMAL LOW
Hemoglobin: 10.7 — ABNORMAL LOW
Platelets: 144 — ABNORMAL LOW
Platelets: 155
RBC: 4.43
WBC: 6.2
WBC: 7.4
WBC: 8.9

## 2011-09-08 LAB — COMPREHENSIVE METABOLIC PANEL
ALT: 19
AST: 21
Albumin: 2.7 — ABNORMAL LOW
Albumin: 4
Alkaline Phosphatase: 32 — ABNORMAL LOW
BUN: 5 — ABNORMAL LOW
CO2: 24
Calcium: 9
Chloride: 104
Chloride: 109
GFR calc Af Amer: >60
GFR calc non Af Amer: >60
Glucose, Bld: 108 — ABNORMAL HIGH
Potassium: 3.9
Sodium: 138
Total Bilirubin: 0.7
Total Bilirubin: 0.8

## 2011-09-08 LAB — TYPE AND SCREEN: ABO/RH(D): A POS

## 2011-09-08 LAB — BASIC METABOLIC PANEL
BUN: 6
Creatinine, Ser: 0.57
GFR calc non Af Amer: >60

## 2011-09-08 LAB — URINALYSIS, ROUTINE W REFLEX MICROSCOPIC
Glucose, UA: NEGATIVE
Ketones, ur: NEGATIVE
Specific Gravity, Urine: 1.017
pH: 6

## 2011-09-08 LAB — BLOOD GAS, ARTERIAL
Drawn by: 181601
Drawn by: 283401
FIO2: 0.21
O2 Content: 2
O2 Saturation: 97.5
Patient temperature: 98.6
pCO2 arterial: 37.4
pH, Arterial: 7.409 — ABNORMAL HIGH
pO2, Arterial: 92.3

## 2011-09-08 LAB — URINE MICROSCOPIC-ADD ON

## 2011-09-10 ENCOUNTER — Telehealth: Payer: Self-pay | Admitting: Gastroenterology

## 2011-09-10 NOTE — Telephone Encounter (Signed)
noted 

## 2011-09-10 NOTE — Telephone Encounter (Signed)
Pt called to let us know she was trying to control her abdominal pain with a change in her diet and that is why she has not had her CT or lab work done- She said she would call back at a later time to give [place] an update

## 2011-09-11 ENCOUNTER — Other Ambulatory Visit (HOSPITAL_COMMUNITY): Payer: Federal, State, Local not specified - PPO

## 2011-10-08 ENCOUNTER — Ambulatory Visit (HOSPITAL_COMMUNITY)
Admission: RE | Admit: 2011-10-08 | Discharge: 2011-10-08 | Disposition: A | Discharge status: Home / Self Care | Payer: Federal, State, Local not specified - PPO | Source: Ambulatory Visit | Attending: Family Medicine | Admitting: Family Medicine

## 2011-10-08 DIAGNOSIS — M8580 Other specified disorders of bone density and structure, unspecified site: Secondary | ICD-10-CM

## 2011-10-08 DIAGNOSIS — Z780 Asymptomatic menopausal state: Secondary | ICD-10-CM | POA: Insufficient documentation

## 2011-10-08 DIAGNOSIS — M818 Other osteoporosis without current pathological fracture: Secondary | ICD-10-CM | POA: Insufficient documentation

## 2011-10-15 ENCOUNTER — Other Ambulatory Visit: Payer: Self-pay

## 2011-10-15 MED ORDER — OMEPRAZOLE 20 MG PO CPDR: 20.0000 mg | DELAYED_RELEASE_CAPSULE | Freq: Every day | ORAL | Status: DC

## 2011-10-20 ENCOUNTER — Other Ambulatory Visit: Payer: Self-pay

## 2011-10-20 MED ORDER — OMEPRAZOLE 20 MG PO CPDR: 20.0000 mg | DELAYED_RELEASE_CAPSULE | Freq: Every day | ORAL | Status: DC

## 2011-12-17 ENCOUNTER — Ambulatory Visit (HOSPITAL_COMMUNITY): Payer: Self-pay | Admitting: Oncology

## 2011-12-23 ENCOUNTER — Other Ambulatory Visit: Payer: Self-pay | Admitting: Family Medicine

## 2011-12-23 ENCOUNTER — Ambulatory Visit (HOSPITAL_COMMUNITY)
Admission: RE | Admit: 2011-12-23 | Discharge: 2011-12-23 | Disposition: A | Discharge status: Home / Self Care | Payer: Federal, State, Local not specified - PPO | Source: Ambulatory Visit | Attending: Family Medicine | Admitting: Family Medicine

## 2011-12-23 ENCOUNTER — Other Ambulatory Visit (HOSPITAL_COMMUNITY): Payer: Federal, State, Local not specified - PPO

## 2011-12-23 DIAGNOSIS — M7989 Other specified soft tissue disorders: Secondary | ICD-10-CM | POA: Insufficient documentation

## 2011-12-23 DIAGNOSIS — R209 Unspecified disturbances of skin sensation: Secondary | ICD-10-CM | POA: Insufficient documentation

## 2011-12-23 DIAGNOSIS — R609 Edema, unspecified: Secondary | ICD-10-CM

## 2011-12-23 DIAGNOSIS — R52 Pain, unspecified: Secondary | ICD-10-CM

## 2011-12-23 DIAGNOSIS — M79609 Pain in unspecified limb: Secondary | ICD-10-CM | POA: Insufficient documentation

## 2012-10-07 ENCOUNTER — Other Ambulatory Visit: Payer: Self-pay | Admitting: Anesthesiology

## 2012-10-07 DIAGNOSIS — M47817 Spondylosis without myelopathy or radiculopathy, lumbosacral region: Secondary | ICD-10-CM

## 2012-10-07 DIAGNOSIS — G35 Multiple sclerosis: Secondary | ICD-10-CM

## 2012-10-18 ENCOUNTER — Ambulatory Visit
Admission: RE | Admit: 2012-10-18 | Discharge: 2012-10-18 | Disposition: A | Discharge status: Home / Self Care | Payer: Federal, State, Local not specified - PPO | Source: Ambulatory Visit | Attending: Anesthesiology | Admitting: Anesthesiology

## 2012-10-18 DIAGNOSIS — G35 Multiple sclerosis: Secondary | ICD-10-CM

## 2012-10-18 DIAGNOSIS — M47817 Spondylosis without myelopathy or radiculopathy, lumbosacral region: Secondary | ICD-10-CM

## 2013-05-05 ENCOUNTER — Other Ambulatory Visit: Payer: Self-pay | Admitting: Psychiatry

## 2013-05-05 DIAGNOSIS — G35 Multiple sclerosis: Secondary | ICD-10-CM

## 2013-05-13 ENCOUNTER — Other Ambulatory Visit: Payer: Federal, State, Local not specified - PPO

## 2013-06-15 ENCOUNTER — Other Ambulatory Visit: Payer: Federal, State, Local not specified - PPO

## 2013-07-03 ENCOUNTER — Encounter: Payer: Self-pay | Admitting: *Deleted

## 2013-07-04 ENCOUNTER — Ambulatory Visit
Admission: RE | Admit: 2013-07-04 | Discharge: 2013-07-04 | Disposition: A | Discharge status: Home / Self Care | Payer: Federal, State, Local not specified - PPO | Source: Ambulatory Visit | Attending: Psychiatry | Admitting: Psychiatry

## 2013-07-04 DIAGNOSIS — G35 Multiple sclerosis: Secondary | ICD-10-CM

## 2013-07-05 ENCOUNTER — Other Ambulatory Visit: Payer: Self-pay | Admitting: Psychiatry

## 2013-07-05 DIAGNOSIS — G379 Demyelinating disease of central nervous system, unspecified: Secondary | ICD-10-CM

## 2013-07-07 ENCOUNTER — Ambulatory Visit (INDEPENDENT_AMBULATORY_CARE_PROVIDER_SITE_OTHER): Payer: BC Managed Care – PPO | Admitting: Nurse Practitioner

## 2013-07-07 ENCOUNTER — Encounter: Payer: Self-pay | Admitting: Nurse Practitioner

## 2013-07-07 VITALS — BP 110/78 | Temp 98.5°F | Ht 66.0 in | Wt 222.0 lb

## 2013-07-07 DIAGNOSIS — R946 Abnormal results of thyroid function studies: Secondary | ICD-10-CM

## 2013-07-07 DIAGNOSIS — E079 Disorder of thyroid, unspecified: Secondary | ICD-10-CM

## 2013-07-07 DIAGNOSIS — E0789 Other specified disorders of thyroid: Secondary | ICD-10-CM

## 2013-07-07 DIAGNOSIS — R1011 Right upper quadrant pain: Secondary | ICD-10-CM

## 2013-07-07 DIAGNOSIS — M810 Age-related osteoporosis without current pathological fracture: Secondary | ICD-10-CM

## 2013-07-07 DIAGNOSIS — K2990 Gastroduodenitis, unspecified, without bleeding: Secondary | ICD-10-CM

## 2013-07-07 DIAGNOSIS — E781 Pure hyperglyceridemia: Secondary | ICD-10-CM

## 2013-07-07 DIAGNOSIS — Z1321 Encounter for screening for nutritional disorder: Secondary | ICD-10-CM

## 2013-07-07 DIAGNOSIS — Z130 Encounter for screening for diseases of the blood and blood-forming organs and certain disorders involving the immune mechanism: Secondary | ICD-10-CM

## 2013-07-07 DIAGNOSIS — K2970 Gastritis, unspecified, without bleeding: Secondary | ICD-10-CM

## 2013-07-07 MED ORDER — PANTOPRAZOLE SODIUM 40 MG PO TBEC: 40.0000 mg | DELAYED_RELEASE_TABLET | Freq: Every day | ORAL | Status: DC

## 2013-07-07 MED ORDER — ALPRAZOLAM 0.5 MG PO TABS: 0.5000 mg | ORAL_TABLET | Freq: Every evening | ORAL | Status: DC | PRN

## 2013-07-08 DIAGNOSIS — M810 Age-related osteoporosis without current pathological fracture: Secondary | ICD-10-CM | POA: Insufficient documentation

## 2013-07-08 NOTE — Progress Notes (Signed)
Subjective:  Presents for recheck. Requesting to have her TSH checked due to some weight gain. Continues followup with her pain specialist in [place]. Taking Xanax for sleep and occasionally during the day if under stress. Having some right upper quadrant pain with swelling at times a for the past 6 months. Localized to this area. No acid reflux or heartburn. No fever. Bowels normal limit. Drinks a lot of caffeine. Nonsmoker. No alcohol use. No NSAID use. No difficulty swallowing. No sore throats. No choking.  Objective:   BP 110/78  Temp(Src) 98.5 F (36.9 C) (Oral)  Ht 5\' 6"  (1.676 m)  Wt 222 lb (100.699 kg)  BMI 35.85 kg/m2 NAD. Alert, oriented. Thyroid no masses or goiters noted, no enlargement. Very tender to palpation. Lungs clear. Heart regular rate rhythm. Abdomen soft nondistended with generalized tenderness from the epigastric area into the right upper quadrant. Mild rebound tenderness. Unable to assess organomegaly due to the level of tenderness.  Assessment:Abdominal pain, right upper quadrant - Plan: Basic metabolic panel, Hepatic function panel, Lipase, US Abdomen Complete  Hypertriglyceridemia - Plan: Lipid panel  Osteoporosis, unspecified - Plan: Vitamin D 25 hydroxy  Encounter for vitamin deficiency screening - Plan: Vitamin D 25 hydroxy  Thyroid pain  Abnormal thyroid exam  Gastritis  Plan: Meds ordered this encounter  Medications  . OVER THE COUNTER MEDICATION    Sig: lethesin 1200mg  every day  . ALPRAZolam (XANAX) 0.5 MG tablet    Sig: Take 1 tablet (0.5 mg total) by mouth at bedtime as needed for sleep.    Dispense:  90 tablet    Refill:  5    May refill monthly    Order Specific Question:  Supervising Provider    Answer:  Merlyn Albert [[id number]]  . pantoprazole (PROTONIX) 40 MG tablet    Sig: Take 1 tablet (40 mg total) by mouth daily. For acid reflux and abd pain    Dispense:  30 tablet    Refill:  5    Order Specific Question:  Supervising Provider   Answer:  Merlyn Albert [[id number]]   Discussed lifestyle factors affecting her reflux symptoms. Further followup based on test results. Warning signs reviewed. Patient to call or go ED sooner if symptoms worsen.

## 2013-07-08 NOTE — Assessment & Plan Note (Addendum)
Start Protonix 40 mg qd.

## 2013-07-11 ENCOUNTER — Other Ambulatory Visit: Payer: Federal, State, Local not specified - PPO

## 2013-07-12 ENCOUNTER — Ambulatory Visit (HOSPITAL_COMMUNITY)
Admission: RE | Admit: 2013-07-12 | Discharge: 2013-07-12 | Disposition: A | Discharge status: Home / Self Care | Payer: Federal, State, Local not specified - PPO | Source: Ambulatory Visit | Attending: Nurse Practitioner | Admitting: Nurse Practitioner

## 2013-07-12 ENCOUNTER — Other Ambulatory Visit: Payer: Self-pay | Admitting: Nurse Practitioner

## 2013-07-12 DIAGNOSIS — K7689 Other specified diseases of liver: Secondary | ICD-10-CM | POA: Insufficient documentation

## 2013-07-12 DIAGNOSIS — R1011 Right upper quadrant pain: Secondary | ICD-10-CM | POA: Insufficient documentation

## 2013-07-12 LAB — BASIC METABOLIC PANEL
BUN: 16 mg/dL (ref 6–23)
Calcium: 9.3 mg/dL (ref 8.4–10.5)
Potassium: 4.3 mEq/L (ref 3.5–5.3)
Sodium: 142 mEq/L (ref 135–145)

## 2013-07-12 LAB — LIPASE: Lipase: 21 U/L (ref 0–75)

## 2013-07-12 LAB — HEPATIC FUNCTION PANEL
AST: 15 U/L (ref 0–37)
Bilirubin, Direct: 0.1 mg/dL (ref 0.0–0.3)
Total Bilirubin: 0.4 mg/dL (ref 0.3–1.2)

## 2013-07-12 LAB — LIPID PANEL
HDL: 45 mg/dL (ref >39)
Total CHOL/HDL Ratio: 4.9 Ratio
Triglycerides: 179 mg/dL — ABNORMAL HIGH (ref <150)

## 2013-07-12 LAB — THYROID PANEL WITH TSH: TSH: 3.785 u[IU]/mL (ref 0.350–4.500)

## 2013-07-13 LAB — VITAMIN D 25 HYDROXY (VIT D DEFICIENCY, FRACTURES): Vit D, 25-Hydroxy: 73 ng/mL (ref 30–89)

## 2013-07-13 LAB — THYROID ANTIBODIES
Thyroglobulin Ab: 25.8 U/mL (ref <40.0)
Thyroperoxidase Ab SerPl-aCnc: <10 IU/mL (ref <35.0)

## 2013-08-10 ENCOUNTER — Other Ambulatory Visit: Payer: Federal, State, Local not specified - PPO

## 2013-09-07 ENCOUNTER — Other Ambulatory Visit: Payer: Federal, State, Local not specified - PPO

## 2013-09-08 ENCOUNTER — Telehealth: Payer: Self-pay | Admitting: Family Medicine

## 2013-09-08 NOTE — Telephone Encounter (Signed)
Ov tomorrow 

## 2013-09-08 NOTE — Telephone Encounter (Signed)
States she has a burning rash over her body.  Needs a prescription if possible.  [facility] in [place]  Please call Patient. Thanks

## 2013-09-08 NOTE — Telephone Encounter (Signed)
Discussed. Pt made office visit.

## 2013-09-09 ENCOUNTER — Ambulatory Visit: Payer: BC Managed Care – PPO | Admitting: Nurse Practitioner

## 2013-10-10 ENCOUNTER — Ambulatory Visit (HOSPITAL_COMMUNITY)
Admission: RE | Admit: 2013-10-10 | Discharge: 2013-10-10 | Disposition: A | Discharge status: Home / Self Care | Payer: Federal, State, Local not specified - PPO | Source: Ambulatory Visit | Attending: Anesthesiology | Admitting: Anesthesiology

## 2013-10-10 DIAGNOSIS — IMO0001 Reserved for inherently not codable concepts without codable children: Secondary | ICD-10-CM | POA: Insufficient documentation

## 2013-10-10 DIAGNOSIS — M62838 Other muscle spasm: Secondary | ICD-10-CM | POA: Insufficient documentation

## 2013-10-10 DIAGNOSIS — M542 Cervicalgia: Secondary | ICD-10-CM | POA: Insufficient documentation
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
## 2013-10-11 ENCOUNTER — Ambulatory Visit
Admission: RE | Admit: 2013-10-11 | Discharge: 2013-10-11 | Disposition: A | Discharge status: Home / Self Care | Payer: Federal, State, Local not specified - PPO | Source: Ambulatory Visit | Attending: Psychiatry | Admitting: Psychiatry

## 2013-10-11 DIAGNOSIS — G379 Demyelinating disease of central nervous system, unspecified: Secondary | ICD-10-CM

## 2013-10-11 MED ORDER — GADOBENATE DIMEGLUMINE 529 MG/ML IV SOLN
18.0000 mL | Freq: Once | INTRAVENOUS | Status: AC | PRN
  Administered 2013-10-11: 18 mL via INTRAVENOUS

## 2013-10-12 ENCOUNTER — Ambulatory Visit (HOSPITAL_COMMUNITY): Payer: Federal, State, Local not specified - PPO | Admitting: *Deleted

## 2013-10-12 ENCOUNTER — Telehealth (HOSPITAL_COMMUNITY): Payer: Self-pay

## 2013-10-24 ENCOUNTER — Ambulatory Visit (HOSPITAL_COMMUNITY): Payer: Federal, State, Local not specified - PPO | Admitting: *Deleted

## 2013-10-26 ENCOUNTER — Ambulatory Visit (HOSPITAL_COMMUNITY): Payer: Federal, State, Local not specified - PPO | Admitting: *Deleted

## 2013-10-31 ENCOUNTER — Ambulatory Visit (HOSPITAL_COMMUNITY): Payer: Federal, State, Local not specified - PPO | Admitting: *Deleted

## 2013-11-02 ENCOUNTER — Ambulatory Visit (HOSPITAL_COMMUNITY): Payer: Federal, State, Local not specified - PPO | Admitting: Physical Therapy

## 2014-02-03 ENCOUNTER — Telehealth: Payer: Self-pay | Admitting: Family Medicine

## 2014-02-03 NOTE — Telephone Encounter (Signed)
Rx prior auth obtained for pt's PANTOPRAZOLE (Protonix), expires 02/02/15 through [place], faxed approval to [facility], notified pt

## 2014-10-13 ENCOUNTER — Ambulatory Visit (INDEPENDENT_AMBULATORY_CARE_PROVIDER_SITE_OTHER): Payer: BC Managed Care – PPO | Admitting: Nurse Practitioner

## 2014-10-13 ENCOUNTER — Encounter: Payer: Self-pay | Admitting: Nurse Practitioner

## 2014-10-13 VITALS — BP 132/80 | HR 64 | Ht 72.0 in | Wt 226.0 lb

## 2014-10-13 DIAGNOSIS — F4322 Adjustment disorder with anxiety: Secondary | ICD-10-CM

## 2014-10-13 DIAGNOSIS — G4700 Insomnia, unspecified: Secondary | ICD-10-CM

## 2014-10-13 DIAGNOSIS — K2100 Gastro-esophageal reflux disease with esophagitis, without bleeding: Secondary | ICD-10-CM

## 2014-10-13 MED ORDER — OMEPRAZOLE 20 MG PO CPDR: 20.0000 mg | DELAYED_RELEASE_CAPSULE | Freq: Two times a day (BID) | ORAL | Status: DC

## 2014-10-13 MED ORDER — ALPRAZOLAM 0.5 MG PO TABS: 0.5000 mg | ORAL_TABLET | Freq: Every evening | ORAL | Status: DC | PRN

## 2014-10-13 MED ORDER — ESCITALOPRAM OXALATE 10 MG PO TABS: 10.0000 mg | ORAL_TABLET | Freq: Every day | ORAL | Status: DC

## 2014-10-17 ENCOUNTER — Encounter: Payer: Self-pay | Admitting: Nurse Practitioner

## 2014-10-17 NOTE — Progress Notes (Signed)
Subjective:  Presents for recheck. Has had an exacerbation of her anxiety mainly due to family issues. Has been taking Xanax on occasion to help sleep which has worsened lately. Having a "fluttering" sensation in the chest mainly noted when she lays down to sleep. Had a work up for this years ago including a holter monitor according to patient. Also burning/pain in the epigastric area/lower sternum. Unassociated with activity. No unusual SOB. Having reflux symptoms. BMs normal. Minimal caffeine intake. Nonsmoker.   Objective:   BP 132/80 mmHg  Pulse 64  Ht 6' (1.829 m)  Wt 226 lb (102.513 kg)  BMI 30.64 kg/m2 NAD. Alert, oriented. Mildly anxious affect. Lungs clear. Heart RRR. No murmur or gallop noted. Abdomen soft, non distended with mild epigastric area tenderness.   Assessment:  Problem List Items Addressed This Visit      Digestive   GERD (gastroesophageal reflux disease) - Primary   Relevant Medications      omeprazole (PRILOSEC) capsule    Other Visit Diagnoses    Insomnia        Adjustment disorder with anxious mood          Plan:  Meds ordered this encounter  Medications  . omeprazole (PRILOSEC) 20 MG capsule    Sig: Take 1 capsule (20 mg total) by mouth 2 (two) times daily before a meal.    Dispense:  60 capsule    Refill:  2    Order Specific Question:  Supervising Provider    Answer:  Mikey Kirschner [[id number]]  . ALPRAZolam (XANAX) 0.5 MG tablet    Sig: Take 1 tablet (0.5 mg total) by mouth at bedtime as needed for sleep.    Dispense:  30 tablet    Refill:  5    May refill monthly    Order Specific Question:  Supervising Provider    Answer:  Mikey Kirschner [[id number]]  . escitalopram (LEXAPRO) 10 MG tablet    Sig: Take 1 tablet (10 mg total) by mouth daily.    Dispense:  30 tablet    Refill:  0    Order Specific Question:  Supervising Provider    Answer:  Mikey Kirschner [[id number]]   Discussed importance of stress reduction. DC Lexapro and call if any problems.  Discussed lifestyle factors affecting reflux.  Return in about 1 month (around 11/12/2014).

## 2014-11-13 ENCOUNTER — Encounter: Payer: BC Managed Care – PPO | Admitting: Nurse Practitioner

## 2015-06-28 DIAGNOSIS — G35 Multiple sclerosis: Secondary | ICD-10-CM | POA: Diagnosis not present

## 2015-06-28 DIAGNOSIS — Z79891 Long term (current) use of opiate analgesic: Secondary | ICD-10-CM | POA: Diagnosis not present

## 2015-06-28 DIAGNOSIS — M47816 Spondylosis without myelopathy or radiculopathy, lumbar region: Secondary | ICD-10-CM | POA: Diagnosis not present

## 2015-06-28 DIAGNOSIS — G890 Central pain syndrome: Secondary | ICD-10-CM | POA: Diagnosis not present

## 2015-06-29 ENCOUNTER — Encounter: Payer: Self-pay | Admitting: Nurse Practitioner

## 2015-06-29 ENCOUNTER — Ambulatory Visit (INDEPENDENT_AMBULATORY_CARE_PROVIDER_SITE_OTHER): Payer: Medicare Other | Admitting: Nurse Practitioner

## 2015-06-29 VITALS — BP 114/82 | Ht 66.0 in | Wt 227.0 lb

## 2015-06-29 DIAGNOSIS — L509 Urticaria, unspecified: Secondary | ICD-10-CM

## 2015-06-29 MED ORDER — SCOPOLAMINE 1 MG/3DAYS TD PT72: 1.0000 | MEDICATED_PATCH | TRANSDERMAL | Status: DC

## 2015-06-29 MED ORDER — ALPRAZOLAM 0.5 MG PO TABS: 0.5000 mg | ORAL_TABLET | Freq: Every evening | ORAL | Status: DC | PRN

## 2015-06-29 NOTE — Patient Instructions (Addendum)
claritin 10 mg in the morning Benadryl 25 mg at night Zantac 150 mg once a day Meclizine 25 mg every 6 hours as needed

## 2015-07-02 ENCOUNTER — Encounter: Payer: Self-pay | Admitting: Nurse Practitioner

## 2015-07-02 NOTE — Progress Notes (Signed)
Subjective:  Presents with complaints of itching on different areas of the body at times, migratory. Feels like "bugs on her skin". No known allergens. No recent changes in her medications. Some increased anxiety. No fever. Minimal rash. No cough or wheeze. No difficulty swallowing or breathing. Is planning a trip, requesting medication to help prevent seasickness.  Objective:   BP 114/82 mmHg  Ht '5\' 6"'$  (1.676 m)  Wt 227 lb (102.967 kg)  BMI 36.66 kg/m2 NAD. Alert, oriented. Calm affect. TMs normal limit. Pharynx clear. Neck supple with minimal adenopathy. Lungs clear. Heart regular rate rhythm. Skin clear.  Assessment: Urticaria  Plan:  Meds ordered this encounter  Medications  . oxymorphone (OPANA ER) 10 MG 12 hr tablet    Sig:   . DISCONTD: OPANA ER, CRUSH RESISTANT, 10 MG T12A 12 hr tablet    Sig:   . DiazePAM (VALIUM PO)    Sig: Take by mouth.  Marland Kitchen scopolamine (TRANSDERM-SCOP) 1 MG/3DAYS    Sig: Place 1 patch (1.5 mg total) onto the skin every 3 (three) days.    Dispense:  4 patch    Refill:  0    Order Specific Question:  Supervising Provider    Answer:  Mikey Kirschner [[id number]]  . ALPRAZolam (XANAX) 0.5 MG tablet    Sig: Take 1 tablet (0.5 mg total) by mouth at bedtime as needed for sleep.    Dispense:  30 tablet    Refill:  5    May refill monthly    Order Specific Question:  Supervising Provider    Answer:  Mikey Kirschner [[id number]]   Possible urticaria versus neuralgia related to MS. claritin 10 mg in the morning Benadryl 25 mg at night Zantac 150 mg once a day Meclizine 25 mg every 6 hours as needed Use meclizine if Transderm-Scop is not tolerated. Trial of medications for urticaria to see if this will help. Patient to call back after her trip if no improvement, to look at other options. To also mention this to her MS specialist at her next visit.

## 2015-08-07 ENCOUNTER — Ambulatory Visit (INDEPENDENT_AMBULATORY_CARE_PROVIDER_SITE_OTHER): Payer: Medicare Other | Admitting: Nurse Practitioner

## 2015-08-07 ENCOUNTER — Encounter: Payer: Self-pay | Admitting: Nurse Practitioner

## 2015-08-07 VITALS — BP 116/80 | Ht 66.0 in | Wt 229.2 lb

## 2015-08-07 DIAGNOSIS — E7800 Pure hypercholesterolemia, unspecified: Secondary | ICD-10-CM

## 2015-08-07 DIAGNOSIS — Z13220 Encounter for screening for lipoid disorders: Secondary | ICD-10-CM | POA: Diagnosis not present

## 2015-08-07 DIAGNOSIS — R5383 Other fatigue: Secondary | ICD-10-CM | POA: Diagnosis not present

## 2015-08-07 DIAGNOSIS — M810 Age-related osteoporosis without current pathological fracture: Secondary | ICD-10-CM | POA: Diagnosis not present

## 2015-08-07 DIAGNOSIS — Z79899 Other long term (current) drug therapy: Secondary | ICD-10-CM | POA: Diagnosis not present

## 2015-08-07 DIAGNOSIS — Z139 Encounter for screening, unspecified: Secondary | ICD-10-CM

## 2015-08-07 DIAGNOSIS — Z0000 Encounter for general adult medical examination without abnormal findings: Secondary | ICD-10-CM

## 2015-08-09 ENCOUNTER — Encounter: Payer: Self-pay | Admitting: Nurse Practitioner

## 2015-08-09 NOTE — Progress Notes (Signed)
   Subjective:    Patient ID: [name], female    DOB: 1950-12-02, 64 y.o.   MRN: [id number]  HPI presents for her wellness exam. Same sexual partner. Has had bilat oophorectomy. No pelvic pain. Overall healthy diet. Minimal exercise. Regular vision and dental exams.     Review of Systems  Constitutional: Negative for activity change, appetite change and fatigue.  HENT: Negative for dental problem, ear pain, sinus pressure and sore throat.   Respiratory: Negative for cough, chest tightness, shortness of breath and wheezing.   Cardiovascular: Negative for chest pain.  Gastrointestinal: Negative for nausea, vomiting, abdominal pain, diarrhea, constipation and abdominal distention.  Genitourinary: Negative for dysuria, urgency, frequency, vaginal discharge, enuresis, difficulty urinating, genital sores and pelvic pain.       Objective:   Physical Exam  Constitutional: She is oriented to person, place, and time. She appears well-developed. No distress.  HENT:  Right Ear: External ear normal.  Left Ear: External ear normal.  Mouth/Throat: Oropharynx is clear and moist.  Neck: Normal range of motion. Neck supple. No tracheal deviation present. No thyromegaly present.  Cardiovascular: Normal rate, regular rhythm and normal heart sounds.  Exam reveals no gallop.   No murmur heard. Pulmonary/Chest: Effort normal and breath sounds normal.  Abdominal: Soft. She exhibits no distension. There is no tenderness.  Genitourinary: Vagina normal. No vaginal discharge found.  External GU: signs of hypoestrogenism. Speculum exam limited due to discomfort. No discharge noted. Bimanual exam: no tenderness or obvious masses. Vaginal discomfort during exam.   Musculoskeletal: She exhibits no edema.  Lymphadenopathy:    She has no cervical adenopathy.  Neurological: She is alert and oriented to person, place, and time.  Skin: Skin is warm and dry. No rash noted.  Psychiatric: She has a normal mood  and affect. Her behavior is normal.  Vitals reviewed. Breast exam: no masses; axillae no adenopathy.        Assessment & Plan:   Problem List Items Addressed This Visit      Musculoskeletal and Integument   Osteoporosis   Relevant Orders   Vit D  25 hydroxy (rtn osteoporosis monitoring)   DG Bone Density    Other Visit Diagnoses    Routine general medical examination at a health care facility    -  Primary    Relevant Orders    MM DIGITAL SCREENING BILATERAL    TSH    Basic metabolic panel    Hepatic function panel    Screening        Relevant Orders    MM DIGITAL SCREENING BILATERAL    Lipid screening        High risk medication use        Relevant Orders    Basic metabolic panel    Hepatic function panel    Hypercholesteremia        Relevant Orders    Lipid panel    Other fatigue        Relevant Orders    TSH      Recommend daily vitamin D and calcium. Refuses zostavax, pneumovax, flu vaccine and hemoccult cards.  Activity as tolerated.  Return in about 1 year (around 08/06/2016) for physical.

## 2015-08-14 ENCOUNTER — Other Ambulatory Visit (HOSPITAL_COMMUNITY): Payer: Federal, State, Local not specified - PPO

## 2015-08-20 ENCOUNTER — Ambulatory Visit (HOSPITAL_COMMUNITY): Payer: Federal, State, Local not specified - PPO

## 2015-10-03 DIAGNOSIS — M47816 Spondylosis without myelopathy or radiculopathy, lumbar region: Secondary | ICD-10-CM | POA: Diagnosis not present

## 2015-10-03 DIAGNOSIS — G890 Central pain syndrome: Secondary | ICD-10-CM | POA: Diagnosis not present

## 2015-10-03 DIAGNOSIS — G35 Multiple sclerosis: Secondary | ICD-10-CM | POA: Diagnosis not present

## 2015-10-03 DIAGNOSIS — M545 Low back pain: Secondary | ICD-10-CM | POA: Diagnosis not present

## 2015-10-08 DIAGNOSIS — H43393 Other vitreous opacities, bilateral: Secondary | ICD-10-CM | POA: Diagnosis not present

## 2016-01-31 DIAGNOSIS — M545 Low back pain: Secondary | ICD-10-CM | POA: Diagnosis not present

## 2016-01-31 DIAGNOSIS — M47816 Spondylosis without myelopathy or radiculopathy, lumbar region: Secondary | ICD-10-CM | POA: Diagnosis not present

## 2016-05-12 DIAGNOSIS — C44519 Basal cell carcinoma of skin of other part of trunk: Secondary | ICD-10-CM | POA: Diagnosis not present

## 2016-05-12 DIAGNOSIS — D225 Melanocytic nevi of trunk: Secondary | ICD-10-CM | POA: Diagnosis not present

## 2016-06-13 ENCOUNTER — Telehealth: Payer: Self-pay | Admitting: Nurse Practitioner

## 2016-06-13 DIAGNOSIS — E785 Hyperlipidemia, unspecified: Secondary | ICD-10-CM

## 2016-06-13 DIAGNOSIS — R5383 Other fatigue: Secondary | ICD-10-CM

## 2016-06-13 DIAGNOSIS — Z79899 Other long term (current) drug therapy: Secondary | ICD-10-CM

## 2016-06-13 NOTE — Telephone Encounter (Signed)
Pt would like to have her labs resent in  Were put in already for sept but she didn't do it   She would like to know if she needs to be fasting

## 2016-06-16 NOTE — Telephone Encounter (Signed)
Notified patient bloodwork has been ordered.  

## 2016-06-16 NOTE — Telephone Encounter (Signed)
Please reorder labs from 9/16. We may not be able to run a vitamin D depending on her insurance. Please advise her that they should be done fasting.

## 2016-06-17 DIAGNOSIS — E785 Hyperlipidemia, unspecified: Secondary | ICD-10-CM | POA: Diagnosis not present

## 2016-06-17 DIAGNOSIS — R5383 Other fatigue: Secondary | ICD-10-CM | POA: Diagnosis not present

## 2016-06-17 DIAGNOSIS — Z79899 Other long term (current) drug therapy: Secondary | ICD-10-CM | POA: Diagnosis not present

## 2016-06-18 LAB — BASIC METABOLIC PANEL
BUN / CREAT RATIO: 25 (ref 12–28)
BUN: 17 mg/dL (ref 8–27)
CO2: 25 mmol/L (ref 18–29)
CREATININE: 0.68 mg/dL (ref 0.57–1.00)
Calcium: 9.3 mg/dL (ref 8.7–10.3)
Chloride: 103 mmol/L (ref 96–106)
GFR calc non Af Amer: 91 mL/min/{1.73_m2} (ref >59)
GFR, EST AFRICAN AMERICAN: 105 mL/min/{1.73_m2} (ref >59)
GLUCOSE: 89 mg/dL (ref 65–99)
Potassium: 4.5 mmol/L (ref 3.5–5.2)
SODIUM: 143 mmol/L (ref 134–144)

## 2016-06-18 LAB — LIPID PANEL
Chol/HDL Ratio: 4.9 ratio units — ABNORMAL HIGH (ref 0.0–4.4)
Cholesterol, Total: 228 mg/dL — ABNORMAL HIGH (ref 100–199)
HDL: 47 mg/dL (ref >39)
LDL Calculated: 135 mg/dL — ABNORMAL HIGH (ref 0–99)
TRIGLYCERIDES: 232 mg/dL — AB (ref 0–149)
VLDL Cholesterol Cal: 46 mg/dL — ABNORMAL HIGH (ref 5–40)

## 2016-06-18 LAB — HEPATIC FUNCTION PANEL
ALK PHOS: 44 IU/L (ref 39–117)
ALT: 14 IU/L (ref 0–32)
AST: 17 IU/L (ref 0–40)
Albumin: 4.3 g/dL (ref 3.6–4.8)
BILIRUBIN, DIRECT: 0.12 mg/dL (ref 0.00–0.40)
Bilirubin Total: 0.4 mg/dL (ref 0.0–1.2)
TOTAL PROTEIN: 7.2 g/dL (ref 6.0–8.5)

## 2016-06-18 LAB — TSH: TSH: 2.9 u[IU]/mL (ref 0.450–4.500)

## 2016-06-26 ENCOUNTER — Encounter: Payer: Self-pay | Admitting: Nurse Practitioner

## 2016-06-26 ENCOUNTER — Ambulatory Visit (INDEPENDENT_AMBULATORY_CARE_PROVIDER_SITE_OTHER): Payer: Medicare Other | Admitting: Nurse Practitioner

## 2016-06-26 VITALS — BP 124/82 | Ht 66.0 in | Wt 232.1 lb

## 2016-06-26 DIAGNOSIS — F419 Anxiety disorder, unspecified: Secondary | ICD-10-CM | POA: Diagnosis not present

## 2016-06-26 DIAGNOSIS — K219 Gastro-esophageal reflux disease without esophagitis: Secondary | ICD-10-CM | POA: Diagnosis not present

## 2016-06-26 DIAGNOSIS — R079 Chest pain, unspecified: Secondary | ICD-10-CM | POA: Diagnosis not present

## 2016-06-26 MED ORDER — ALPRAZOLAM 0.5 MG PO TABS: 0.5000 mg | ORAL_TABLET | Freq: Two times a day (BID) | ORAL | 5 refills | Status: DC | PRN

## 2016-06-26 MED ORDER — RANITIDINE HCL 300 MG PO TABS: 300.0000 mg | ORAL_TABLET | Freq: Every day | ORAL | 5 refills | Status: DC

## 2016-06-28 ENCOUNTER — Encounter: Payer: Self-pay | Admitting: Nurse Practitioner

## 2016-06-28 DIAGNOSIS — F419 Anxiety disorder, unspecified: Secondary | ICD-10-CM | POA: Insufficient documentation

## 2016-06-28 NOTE — Progress Notes (Signed)
Subjective:  Presents for upper mid abd pain with occasional chest pain. Worse with certain foods and with lying down. Has a history of reflux. Is not taking any medicine at this point. Constipation at times. Has seen GI specialist in the past. Has some SOB but no chest pain with activity. Has been very inactive lately. More stress and anxiety lately. Took Xanax up to 2 times per day at one point. Having panic attacks at times. Continues followup with her pain specialist. Interested in cardiac work up as a precaution. Her brother had a CABG in his 11s and died at 66.   Objective:   BP 124/82   Ht '5\' 6"'$  (1.676 m)   Wt 232 lb 2 oz (105.3 kg)   BMI 37.47 kg/m  NAD. Alert, oriented. Lungs clear. Heart RRR. Abdomen soft, non distended, obese with moderate epigastric area tenderness.   Assessment:  Problem List Items Addressed This Visit      Digestive   GERD (gastroesophageal reflux disease) - Primary   Relevant Medications   ranitidine (ZANTAC) 300 MG tablet     Other   Anxiety   Relevant Medications   ALPRAZolam (XANAX) 0.5 MG tablet    Other Visit Diagnoses    Chest pain, unspecified chest pain type          Plan:  Meds ordered this encounter  Medications  . ranitidine (ZANTAC) 300 MG tablet    Sig: Take 1 tablet (300 mg total) by mouth at bedtime.    Dispense:  30 tablet    Refill:  5    Order Specific Question:   Supervising Provider    Answer:   Mikey Kirschner [[id number]]  . ALPRAZolam (XANAX) 0.5 MG tablet    Sig: Take 1 tablet (0.5 mg total) by mouth 2 (two) times daily as needed for anxiety.    Dispense:  60 tablet    Refill:  5    May refill monthly    Order Specific Question:   Supervising Provider    Answer:   Mikey Kirschner [[id number]]   Patient refuses PPI. Start Zantac as directed. Call back in 2 weeks if no improvement. Defers daily med for anxiety. Does not take Valium. Patient advised not to take Xanax with pain med. Also advised that we will not increase  strength or number per month beyond this due to risk with current meds. Verbalizes understanding. Refer to cardiology for evaluation.  Return if symptoms worsen or fail to improve.

## 2016-06-30 ENCOUNTER — Encounter: Payer: Self-pay | Admitting: Family Medicine

## 2016-07-07 DIAGNOSIS — M47817 Spondylosis without myelopathy or radiculopathy, lumbosacral region: Secondary | ICD-10-CM | POA: Diagnosis not present

## 2016-07-07 DIAGNOSIS — G35 Multiple sclerosis: Secondary | ICD-10-CM | POA: Diagnosis not present

## 2016-07-07 DIAGNOSIS — G8929 Other chronic pain: Secondary | ICD-10-CM | POA: Diagnosis not present

## 2016-07-07 DIAGNOSIS — Z79899 Other long term (current) drug therapy: Secondary | ICD-10-CM | POA: Diagnosis not present

## 2016-07-07 DIAGNOSIS — M47812 Spondylosis without myelopathy or radiculopathy, cervical region: Secondary | ICD-10-CM | POA: Diagnosis not present

## 2016-07-24 ENCOUNTER — Ambulatory Visit: Payer: Federal, State, Local not specified - PPO | Admitting: Cardiovascular Disease

## 2016-08-07 DIAGNOSIS — Z79899 Other long term (current) drug therapy: Secondary | ICD-10-CM | POA: Diagnosis not present

## 2016-08-07 DIAGNOSIS — G8929 Other chronic pain: Secondary | ICD-10-CM | POA: Diagnosis not present

## 2016-08-07 DIAGNOSIS — M47817 Spondylosis without myelopathy or radiculopathy, lumbosacral region: Secondary | ICD-10-CM | POA: Diagnosis not present

## 2016-08-07 DIAGNOSIS — G35 Multiple sclerosis: Secondary | ICD-10-CM | POA: Diagnosis not present

## 2016-08-07 DIAGNOSIS — M47812 Spondylosis without myelopathy or radiculopathy, cervical region: Secondary | ICD-10-CM | POA: Diagnosis not present

## 2016-09-09 DIAGNOSIS — G894 Chronic pain syndrome: Secondary | ICD-10-CM | POA: Diagnosis not present

## 2016-09-09 DIAGNOSIS — F329 Major depressive disorder, single episode, unspecified: Secondary | ICD-10-CM | POA: Diagnosis not present

## 2016-09-09 DIAGNOSIS — G35 Multiple sclerosis: Secondary | ICD-10-CM | POA: Diagnosis not present

## 2016-09-09 DIAGNOSIS — Z79891 Long term (current) use of opiate analgesic: Secondary | ICD-10-CM | POA: Diagnosis not present

## 2016-09-15 ENCOUNTER — Ambulatory Visit: Payer: Medicare Other | Admitting: Nurse Practitioner

## 2016-10-07 DIAGNOSIS — M47812 Spondylosis without myelopathy or radiculopathy, cervical region: Secondary | ICD-10-CM | POA: Diagnosis not present

## 2016-10-07 DIAGNOSIS — G35 Multiple sclerosis: Secondary | ICD-10-CM | POA: Diagnosis not present

## 2016-10-07 DIAGNOSIS — G894 Chronic pain syndrome: Secondary | ICD-10-CM | POA: Diagnosis not present

## 2016-10-07 DIAGNOSIS — Z79891 Long term (current) use of opiate analgesic: Secondary | ICD-10-CM | POA: Diagnosis not present

## 2017-01-08 ENCOUNTER — Telehealth: Payer: Self-pay | Admitting: Family Medicine

## 2017-01-08 DIAGNOSIS — G35 Multiple sclerosis: Secondary | ICD-10-CM

## 2017-01-08 NOTE — Telephone Encounter (Signed)
ok 

## 2017-01-08 NOTE — Telephone Encounter (Signed)
Pt needs a new referral back to Dr. Felecia Shelling She's seen him before but it's been a while and now needs a referral Seen him for her MS Please initiate referral in system so that I may process

## 2017-01-09 NOTE — Telephone Encounter (Signed)
Referral ordered in epic for Dr.Sater

## 2017-01-12 ENCOUNTER — Encounter: Payer: Self-pay | Admitting: Family Medicine

## 2017-02-11 ENCOUNTER — Ambulatory Visit: Payer: Medicare Other | Admitting: Neurology

## 2017-03-05 ENCOUNTER — Encounter: Payer: Self-pay | Admitting: Neurology

## 2017-03-05 ENCOUNTER — Ambulatory Visit (INDEPENDENT_AMBULATORY_CARE_PROVIDER_SITE_OTHER): Payer: Medicare Other | Admitting: Neurology

## 2017-03-05 ENCOUNTER — Encounter (INDEPENDENT_AMBULATORY_CARE_PROVIDER_SITE_OTHER): Payer: Self-pay

## 2017-03-05 VITALS — BP 146/84 | HR 68 | Resp 20 | Ht 66.0 in | Wt 238.5 lb

## 2017-03-05 DIAGNOSIS — R269 Unspecified abnormalities of gait and mobility: Secondary | ICD-10-CM

## 2017-03-05 DIAGNOSIS — M62838 Other muscle spasm: Secondary | ICD-10-CM

## 2017-03-05 DIAGNOSIS — G35 Multiple sclerosis: Secondary | ICD-10-CM | POA: Diagnosis not present

## 2017-03-05 MED ORDER — CYCLOBENZAPRINE HCL 5 MG PO TABS: 5.0000 mg | ORAL_TABLET | Freq: Every day | ORAL | 5 refills | Status: DC
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
## 2017-03-24 DIAGNOSIS — M47817 Spondylosis without myelopathy or radiculopathy, lumbosacral region: Secondary | ICD-10-CM | POA: Diagnosis not present

## 2017-03-24 DIAGNOSIS — M47812 Spondylosis without myelopathy or radiculopathy, cervical region: Secondary | ICD-10-CM | POA: Diagnosis not present

## 2017-03-24 DIAGNOSIS — Z79891 Long term (current) use of opiate analgesic: Secondary | ICD-10-CM | POA: Diagnosis not present

## 2017-03-24 DIAGNOSIS — G894 Chronic pain syndrome: Secondary | ICD-10-CM | POA: Diagnosis not present

## 2017-03-24 DIAGNOSIS — G35 Multiple sclerosis: Secondary | ICD-10-CM | POA: Diagnosis not present

## 2017-03-28 ENCOUNTER — Other Ambulatory Visit: Payer: Federal, State, Local not specified - PPO

## 2017-03-31 ENCOUNTER — Encounter: Payer: Self-pay | Admitting: Nurse Practitioner

## 2017-03-31 ENCOUNTER — Ambulatory Visit (INDEPENDENT_AMBULATORY_CARE_PROVIDER_SITE_OTHER): Payer: Medicare Other | Admitting: Nurse Practitioner

## 2017-03-31 VITALS — BP 132/82 | Ht 66.0 in | Wt 238.8 lb

## 2017-03-31 DIAGNOSIS — M62838 Other muscle spasm: Secondary | ICD-10-CM

## 2017-03-31 DIAGNOSIS — F419 Anxiety disorder, unspecified: Secondary | ICD-10-CM

## 2017-03-31 MED ORDER — ALPRAZOLAM 0.5 MG PO TABS: 0.5000 mg | ORAL_TABLET | Freq: Every evening | ORAL | 5 refills | Status: DC | PRN

## 2017-04-01 ENCOUNTER — Encounter: Payer: Self-pay | Admitting: Nurse Practitioner

## 2017-04-01 NOTE — Progress Notes (Signed)
Subjective:  Presents for recheck on her anxiety. Describes as mainly spasms twitchiness especially at nighttime. Takes occasional Xanax, not every night which greatly helps. Is seeing Dr. Francesco Runner for her pain management. Last appointment was last week. On daily hydrocodone with a rare use of morphine. Does not use this at nighttime with her Xanax.  Objective:   BP 132/82   Ht '5\' 6"'$  (1.676 m)   Wt 238 lb 12.8 oz (108.3 kg)   BMI 38.54 kg/m  NAD. Alert, oriented. Lungs clear. Heart regular rate rhythm.  Assessment:   Problem List Items Addressed This Visit      Other   Anxiety - Primary   Relevant Medications   ALPRAZolam (XANAX) 0.5 MG tablet   Spasm of muscle       Plan:   Meds ordered this encounter  Medications  . ALPRAZolam (XANAX) 0.5 MG tablet    Sig: Take 1 tablet (0.5 mg total) by mouth at bedtime as needed for sleep.    Dispense:  30 tablet    Refill:  5    Order Specific Question:   Supervising Provider    Answer:   Mikey Kirschner [[id number]]   The number of Xanax was reduced back to 30 per month, due to pain medicine use. Patient understands not to take medicine within 4 hours of taking her pain medicine. Reviewed potential problems associated with using both medications. Continue follow-upwith pain specialist.

## 2017-05-12 DIAGNOSIS — G894 Chronic pain syndrome: Secondary | ICD-10-CM | POA: Diagnosis not present

## 2017-05-12 DIAGNOSIS — Z79899 Other long term (current) drug therapy: Secondary | ICD-10-CM | POA: Diagnosis not present

## 2017-05-12 DIAGNOSIS — M47812 Spondylosis without myelopathy or radiculopathy, cervical region: Secondary | ICD-10-CM | POA: Diagnosis not present

## 2017-05-12 DIAGNOSIS — M47817 Spondylosis without myelopathy or radiculopathy, lumbosacral region: Secondary | ICD-10-CM | POA: Diagnosis not present

## 2017-05-12 DIAGNOSIS — G35 Multiple sclerosis: Secondary | ICD-10-CM | POA: Diagnosis not present

## 2017-06-19 ENCOUNTER — Other Ambulatory Visit: Payer: Self-pay

## 2017-06-26 ENCOUNTER — Telehealth: Payer: Self-pay | Admitting: Family Medicine

## 2017-06-26 DIAGNOSIS — Z79891 Long term (current) use of opiate analgesic: Secondary | ICD-10-CM

## 2017-06-26 NOTE — Telephone Encounter (Signed)
Let's do 

## 2017-06-26 NOTE — Telephone Encounter (Signed)
Referral ordered in epic

## 2017-06-26 NOTE — Telephone Encounter (Signed)
Pt called & states she received a letter stating the Dr. Isabelle Course office is closing & pt needs new pain management provider  Please initiate referral in system so that I may process

## 2017-06-30 ENCOUNTER — Other Ambulatory Visit: Payer: Self-pay | Admitting: Family Medicine

## 2017-06-30 ENCOUNTER — Ambulatory Visit (INDEPENDENT_AMBULATORY_CARE_PROVIDER_SITE_OTHER): Payer: Medicare Other | Admitting: Nurse Practitioner

## 2017-06-30 ENCOUNTER — Encounter: Payer: Self-pay | Admitting: Nurse Practitioner

## 2017-06-30 VITALS — BP 134/88 | Temp 98.1°F | Ht 66.0 in | Wt 234.0 lb

## 2017-06-30 DIAGNOSIS — Z08 Encounter for follow-up examination after completed treatment for malignant neoplasm: Secondary | ICD-10-CM

## 2017-06-30 DIAGNOSIS — G8929 Other chronic pain: Secondary | ICD-10-CM | POA: Diagnosis not present

## 2017-06-30 DIAGNOSIS — Z85118 Personal history of other malignant neoplasm of bronchus and lung: Secondary | ICD-10-CM | POA: Diagnosis not present

## 2017-06-30 DIAGNOSIS — K625 Hemorrhage of anus and rectum: Secondary | ICD-10-CM | POA: Diagnosis not present

## 2017-06-30 DIAGNOSIS — M546 Pain in thoracic spine: Secondary | ICD-10-CM

## 2017-06-30 MED ORDER — HYDROCODONE-ACETAMINOPHEN 10-325 MG PO TABS: 1.0000 | ORAL_TABLET | Freq: Four times a day (QID) | ORAL | 0 refills | Status: DC | PRN

## 2017-06-30 NOTE — Progress Notes (Signed)
Subjective:  Patient presents for multiple complaints. Has noted "fresh blood" in her bowel movements. Mainly a small amount of blood on the BM, none with wiping. Has a history of hemorrhoids that she said is stable. Has an appointment with GI on 9/14. Constipation well controlled at this point with Metamucil, states it is not a problem. No fever. Rare abdominal pain. No family history of colon cancer. Also requesting a CT scan of the chest for surveillance of history of lung cancer. Has appointment with pain management specialist on 8/28. Her previous provider Dr. Francesco Runner closed his practice. Patient will have a gap in her pain medicine beginning this weekend until that time. Currently on hydrocodone 10 mg 4 per day.  Objective:   BP 134/88   Temp 98.1 F (36.7 C) (Oral)   Ht 5\' 6"  (1.676 m)   Wt 234 lb (106.1 kg)   BMI 37.77 kg/m  NAD. Alert, oriented. Lungs clear. Heart regular rate rhythm. Abdomen soft nondistended with distinct epigastric area tenderness and very mild tenderness towards the left lower quadrant. No obvious masses.  Assessment:   Problem List Items Addressed This Visit      Digestive   RECTAL BLEEDING - Primary     Other   Chronic back pain   Relevant Medications   HYDROcodone-acetaminophen (NORCO) 10-325 MG tablet    Other Visit Diagnoses    Encounter for follow-up surveillance of lung cancer       Relevant Orders   CT Chest Wo Contrast   Encounter for chronic pain management           Plan:   Meds ordered this encounter  Medications  . HYDROcodone-acetaminophen (NORCO) 10-325 MG tablet    Sig: Take 1 tablet by mouth every 6 (six) hours as needed.    Dispense:  120 tablet    Refill:  0    Order Specific Question:   Supervising Provider    Answer:   Maggie Font   Consulted with Dr. Nicki Reaper. Patient given a one-time prescription for a one-month supply of her pain medication which needs to last her until her appointment with pain specialist. A  note was given on the prescription that she can start this on 8/2. NCCSR was reviewed. Advised patient to use caution taking pain medication with low dose alprazolam. Follow-up here as needed.

## 2017-07-06 ENCOUNTER — Inpatient Hospital Stay: Admission: RE | Admit: 2017-07-06 | Payer: Federal, State, Local not specified - PPO | Source: Ambulatory Visit

## 2017-07-28 DIAGNOSIS — G8929 Other chronic pain: Secondary | ICD-10-CM | POA: Diagnosis not present

## 2017-07-28 DIAGNOSIS — M542 Cervicalgia: Secondary | ICD-10-CM | POA: Diagnosis not present

## 2017-07-28 DIAGNOSIS — M545 Low back pain: Secondary | ICD-10-CM | POA: Diagnosis not present

## 2017-07-28 DIAGNOSIS — Z79899 Other long term (current) drug therapy: Secondary | ICD-10-CM | POA: Diagnosis not present

## 2017-07-31 ENCOUNTER — Telehealth: Payer: Self-pay | Admitting: Family Medicine

## 2017-07-31 DIAGNOSIS — R52 Pain, unspecified: Secondary | ICD-10-CM

## 2017-07-31 NOTE — Telephone Encounter (Signed)
That doc bounces around more than a basketball, sure

## 2017-07-31 NOTE — Telephone Encounter (Signed)
Pt called needing referral to Dr. Francesco Runner (he's now practicing at [facility] in [place])  Ph# [phone] Fx# [phone]  Please initiate pain management referral in system so that I may process

## 2017-08-04 ENCOUNTER — Encounter: Payer: Self-pay | Admitting: Family Medicine

## 2017-08-06 ENCOUNTER — Ambulatory Visit
Admission: RE | Admit: 2017-08-06 | Discharge: 2017-08-06 | Disposition: A | Discharge status: Home / Self Care | Payer: Medicare Other | Source: Ambulatory Visit | Attending: Nurse Practitioner | Admitting: Nurse Practitioner

## 2017-08-06 ENCOUNTER — Ambulatory Visit
Admission: RE | Admit: 2017-08-06 | Discharge: 2017-08-06 | Disposition: A | Discharge status: Home / Self Care | Payer: Medicare Other | Source: Ambulatory Visit | Attending: Neurology | Admitting: Neurology

## 2017-08-06 DIAGNOSIS — R911 Solitary pulmonary nodule: Secondary | ICD-10-CM | POA: Diagnosis not present

## 2017-08-06 DIAGNOSIS — R269 Unspecified abnormalities of gait and mobility: Secondary | ICD-10-CM

## 2017-08-06 DIAGNOSIS — G35 Multiple sclerosis: Secondary | ICD-10-CM

## 2017-08-06 DIAGNOSIS — Z85118 Personal history of other malignant neoplasm of bronchus and lung: Principal | ICD-10-CM

## 2017-08-06 DIAGNOSIS — Z08 Encounter for follow-up examination after completed treatment for malignant neoplasm: Secondary | ICD-10-CM

## 2017-08-13 ENCOUNTER — Encounter: Payer: Self-pay | Admitting: Gastroenterology

## 2017-08-14 ENCOUNTER — Ambulatory Visit: Payer: Federal, State, Local not specified - PPO | Admitting: Gastroenterology

## 2017-08-25 ENCOUNTER — Other Ambulatory Visit: Payer: Self-pay | Admitting: Nurse Practitioner

## 2017-08-25 DIAGNOSIS — R911 Solitary pulmonary nodule: Secondary | ICD-10-CM

## 2017-08-26 ENCOUNTER — Encounter: Payer: Self-pay | Admitting: Family Medicine

## 2017-09-01 ENCOUNTER — Ambulatory Visit (INDEPENDENT_AMBULATORY_CARE_PROVIDER_SITE_OTHER): Payer: Medicare Other | Admitting: Pulmonary Disease

## 2017-09-01 ENCOUNTER — Encounter: Payer: Self-pay | Admitting: Pulmonary Disease

## 2017-09-01 DIAGNOSIS — R911 Solitary pulmonary nodule: Secondary | ICD-10-CM | POA: Diagnosis not present
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
## 2017-09-01 NOTE — Assessment & Plan Note (Signed)
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

## 2017-09-01 NOTE — Patient Instructions (Signed)
CT chest Wo contrast in 6 months to follow-up on tiny 3 mm nodule in the right upper lung

## 2017-09-08 ENCOUNTER — Ambulatory Visit (INDEPENDENT_AMBULATORY_CARE_PROVIDER_SITE_OTHER): Payer: Medicare Other | Admitting: Neurology

## 2017-09-08 ENCOUNTER — Encounter: Payer: Self-pay | Admitting: Neurology

## 2017-09-08 VITALS — BP 169/84 | HR 63 | Resp 20 | Ht 66.0 in | Wt 232.5 lb

## 2017-09-08 DIAGNOSIS — M62838 Other muscle spasm: Secondary | ICD-10-CM | POA: Diagnosis not present

## 2017-09-08 DIAGNOSIS — G35 Multiple sclerosis: Secondary | ICD-10-CM

## 2017-09-08 DIAGNOSIS — M546 Pain in thoracic spine: Secondary | ICD-10-CM

## 2017-09-08 DIAGNOSIS — F419 Anxiety disorder, unspecified: Secondary | ICD-10-CM | POA: Diagnosis not present

## 2017-09-08 DIAGNOSIS — G8929 Other chronic pain: Secondary | ICD-10-CM | POA: Diagnosis not present

## 2017-09-08 DIAGNOSIS — R269 Unspecified abnormalities of gait and mobility: Secondary | ICD-10-CM | POA: Diagnosis not present

## 2017-09-08 MED ORDER — GABAPENTIN 300 MG PO CAPS: 300.0000 mg | ORAL_CAPSULE | Freq: Three times a day (TID) | ORAL | 11 refills | Status: DC
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
## 2017-09-23 ENCOUNTER — Ambulatory Visit: Payer: Federal, State, Local not specified - PPO | Admitting: Gastroenterology

## 2017-09-28 DIAGNOSIS — G8929 Other chronic pain: Secondary | ICD-10-CM | POA: Diagnosis not present

## 2017-09-28 DIAGNOSIS — Z79899 Other long term (current) drug therapy: Secondary | ICD-10-CM | POA: Diagnosis not present

## 2017-09-28 DIAGNOSIS — M542 Cervicalgia: Secondary | ICD-10-CM | POA: Diagnosis not present

## 2017-09-28 DIAGNOSIS — M545 Low back pain: Secondary | ICD-10-CM | POA: Diagnosis not present

## 2017-10-06 ENCOUNTER — Other Ambulatory Visit: Payer: Self-pay | Admitting: Physician Assistant

## 2017-10-06 DIAGNOSIS — G8929 Other chronic pain: Secondary | ICD-10-CM

## 2017-10-12 ENCOUNTER — Ambulatory Visit (HOSPITAL_COMMUNITY): Payer: Medicare Other

## 2017-10-26 ENCOUNTER — Ambulatory Visit (HOSPITAL_COMMUNITY)
Admission: RE | Admit: 2017-10-26 | Discharge: 2017-10-26 | Disposition: A | Discharge status: Home / Self Care | Payer: Medicare Other | Source: Ambulatory Visit | Attending: Physician Assistant | Admitting: Physician Assistant

## 2017-10-26 DIAGNOSIS — Z79899 Other long term (current) drug therapy: Secondary | ICD-10-CM | POA: Diagnosis not present

## 2017-10-26 DIAGNOSIS — I700 Atherosclerosis of aorta: Secondary | ICD-10-CM | POA: Diagnosis not present

## 2017-10-26 DIAGNOSIS — M47816 Spondylosis without myelopathy or radiculopathy, lumbar region: Secondary | ICD-10-CM | POA: Insufficient documentation

## 2017-10-26 DIAGNOSIS — M5126 Other intervertebral disc displacement, lumbar region: Secondary | ICD-10-CM | POA: Diagnosis not present

## 2017-10-26 DIAGNOSIS — G8929 Other chronic pain: Secondary | ICD-10-CM

## 2017-11-12 ENCOUNTER — Ambulatory Visit (INDEPENDENT_AMBULATORY_CARE_PROVIDER_SITE_OTHER): Payer: Medicare Other | Admitting: Nurse Practitioner

## 2017-11-12 ENCOUNTER — Encounter: Payer: Self-pay | Admitting: Nurse Practitioner

## 2017-11-12 VITALS — BP 124/76 | Ht 66.0 in

## 2017-11-12 DIAGNOSIS — G8929 Other chronic pain: Secondary | ICD-10-CM | POA: Diagnosis not present

## 2017-11-12 DIAGNOSIS — M545 Low back pain, unspecified: Secondary | ICD-10-CM

## 2017-11-12 DIAGNOSIS — M546 Pain in thoracic spine: Secondary | ICD-10-CM | POA: Diagnosis not present

## 2017-11-12 DIAGNOSIS — K219 Gastro-esophageal reflux disease without esophagitis: Secondary | ICD-10-CM

## 2017-11-12 MED ORDER — HYDROCODONE-ACETAMINOPHEN 10-325 MG PO TABS: 1.0000 | ORAL_TABLET | Freq: Four times a day (QID) | ORAL | 0 refills | Status: AC | PRN

## 2017-11-12 NOTE — Progress Notes (Signed)
Subjective: Presents to discuss chronic back pain.  Is changing back to her previous provider for pain Dr. Francesco Runner.  Was not satisfied with the care given at her recent pain clinic.  Stopped gabapentin due to side effects.  Has an appointment 12/19 with new pain clinic.  Was originally scheduled for 12/10 but had to reschedule due to snow.  At this time mainly having pain in the left lower thoracic area radiating around into the left side.  Sporadic.  Worse with certain movements.  Chronic neck and lumbar pain.  Has had a recent scan of her low back area.  Would also like a referral to gastroenterology in [place].  Objective:   BP 124/76   Ht 5\' 6"  (1.676 m)   BMI 37.53 kg/m  NAD.  Alert, oriented.  Lungs clear.  Heart regular rate and rhythm.  Abdomen soft obese with several areas of discomfort.  No obvious masses but exam limited due to abdominal girth.  No rebound or guarding.  No evidence of an acute abdomen.  Tenderness with palpation of the lumbar sacral area into the lower thoracic area especially the left side.  Assessment:   Problem List Items Addressed This Visit      Digestive   GERD (gastroesophageal reflux disease)     Other   Chronic back pain - Primary   Relevant Medications   HYDROcodone-acetaminophen (NORCO) 10-325 MG tablet       Plan:   Meds ordered this encounter  Medications  . HYDROcodone-acetaminophen (NORCO) 10-325 MG tablet    Sig: Take 1 tablet by mouth every 6 (six) hours as needed. Max 4 per day    Dispense:  28 tablet    Refill:  0    Order Specific Question:   Supervising Provider    Answer:   Mikey Kirschner [[id number]]   Patient is currently on 4 of her hydrocodone per day.  Her bottle shows she has enough for today, given prescription to last her till next week when pain management will take this back over.  Patient verbalizes understanding.  Will refer to gastroenterology per patient request.  Declines flu vaccine.  Recheck here as needed. PMP was  unavailable online to check registry.

## 2017-11-12 NOTE — Patient Instructions (Signed)
Follow up with pain specialist next Wednesday as planned.

## 2017-11-18 DIAGNOSIS — M47812 Spondylosis without myelopathy or radiculopathy, cervical region: Secondary | ICD-10-CM | POA: Insufficient documentation

## 2017-11-18 DIAGNOSIS — M5459 Other low back pain: Secondary | ICD-10-CM | POA: Insufficient documentation

## 2017-11-18 DIAGNOSIS — M47816 Spondylosis without myelopathy or radiculopathy, lumbar region: Secondary | ICD-10-CM | POA: Insufficient documentation

## 2017-11-18 DIAGNOSIS — Z79891 Long term (current) use of opiate analgesic: Secondary | ICD-10-CM | POA: Diagnosis not present

## 2017-11-18 DIAGNOSIS — M545 Low back pain: Secondary | ICD-10-CM | POA: Diagnosis not present

## 2017-11-18 DIAGNOSIS — M797 Fibromyalgia: Secondary | ICD-10-CM | POA: Diagnosis not present

## 2017-11-18 DIAGNOSIS — G894 Chronic pain syndrome: Secondary | ICD-10-CM | POA: Insufficient documentation

## 2017-11-18 DIAGNOSIS — M2550 Pain in unspecified joint: Secondary | ICD-10-CM | POA: Diagnosis not present

## 2017-11-18 DIAGNOSIS — G35 Multiple sclerosis: Secondary | ICD-10-CM | POA: Diagnosis not present

## 2017-11-18 DIAGNOSIS — M4316 Spondylolisthesis, lumbar region: Secondary | ICD-10-CM | POA: Diagnosis not present

## 2017-11-18 HISTORY — DX: Other low back pain: M54.59

## 2017-12-15 DIAGNOSIS — M47812 Spondylosis without myelopathy or radiculopathy, cervical region: Secondary | ICD-10-CM | POA: Diagnosis not present

## 2017-12-15 DIAGNOSIS — M47816 Spondylosis without myelopathy or radiculopathy, lumbar region: Secondary | ICD-10-CM | POA: Diagnosis not present

## 2017-12-15 DIAGNOSIS — G894 Chronic pain syndrome: Secondary | ICD-10-CM | POA: Diagnosis not present

## 2018-01-14 ENCOUNTER — Telehealth: Payer: Self-pay | Admitting: Pulmonary Disease

## 2018-01-14 NOTE — Telephone Encounter (Signed)
Spoke with pt, advised she can have her CT done at [facility]. PCC's can we make sure this happens? She is due for CT in April.

## 2018-01-14 NOTE — Telephone Encounter (Signed)
I have documented in the order that she wants it at [facility]

## 2018-02-05 ENCOUNTER — Telehealth: Payer: Self-pay | Admitting: Pulmonary Disease

## 2018-02-05 NOTE — Telephone Encounter (Signed)
RA ok for pt to see NP after CT  Follow up?

## 2018-02-05 NOTE — Telephone Encounter (Signed)
Left message for patient to call back  

## 2018-02-05 NOTE — Telephone Encounter (Signed)
Ok with me 

## 2018-02-08 NOTE — Telephone Encounter (Signed)
Spoke with pt, she states she will call after her CT is done. SHe is ok with RA calling her after the results. Will close encounter, nothing further is needed.

## 2018-03-04 ENCOUNTER — Ambulatory Visit (HOSPITAL_COMMUNITY)
Admission: RE | Admit: 2018-03-04 | Discharge: 2018-03-04 | Disposition: A | Discharge status: Home / Self Care | Payer: Medicare Other | Source: Ambulatory Visit | Attending: Pulmonary Disease | Admitting: Pulmonary Disease

## 2018-03-04 DIAGNOSIS — R918 Other nonspecific abnormal finding of lung field: Secondary | ICD-10-CM | POA: Insufficient documentation

## 2018-03-04 DIAGNOSIS — R911 Solitary pulmonary nodule: Secondary | ICD-10-CM

## 2018-03-04 DIAGNOSIS — J432 Centrilobular emphysema: Secondary | ICD-10-CM | POA: Insufficient documentation

## 2018-03-04 DIAGNOSIS — I700 Atherosclerosis of aorta: Secondary | ICD-10-CM | POA: Diagnosis not present

## 2018-03-04 DIAGNOSIS — I2510 Atherosclerotic heart disease of native coronary artery without angina pectoris: Secondary | ICD-10-CM | POA: Diagnosis not present

## 2018-03-09 ENCOUNTER — Ambulatory Visit: Payer: Medicare Other | Admitting: Neurology

## 2018-03-16 DIAGNOSIS — M797 Fibromyalgia: Secondary | ICD-10-CM | POA: Diagnosis not present

## 2018-03-16 DIAGNOSIS — G894 Chronic pain syndrome: Secondary | ICD-10-CM | POA: Diagnosis not present

## 2018-03-16 DIAGNOSIS — M2550 Pain in unspecified joint: Secondary | ICD-10-CM | POA: Diagnosis not present

## 2018-03-16 DIAGNOSIS — M47812 Spondylosis without myelopathy or radiculopathy, cervical region: Secondary | ICD-10-CM | POA: Diagnosis not present

## 2018-03-16 DIAGNOSIS — M47816 Spondylosis without myelopathy or radiculopathy, lumbar region: Secondary | ICD-10-CM | POA: Diagnosis not present

## 2018-05-27 DIAGNOSIS — Z79891 Long term (current) use of opiate analgesic: Secondary | ICD-10-CM | POA: Diagnosis not present

## 2018-05-27 DIAGNOSIS — M797 Fibromyalgia: Secondary | ICD-10-CM | POA: Diagnosis not present

## 2018-05-27 DIAGNOSIS — M47816 Spondylosis without myelopathy or radiculopathy, lumbar region: Secondary | ICD-10-CM | POA: Diagnosis not present

## 2018-05-27 DIAGNOSIS — M2550 Pain in unspecified joint: Secondary | ICD-10-CM | POA: Diagnosis not present

## 2018-05-27 DIAGNOSIS — M47812 Spondylosis without myelopathy or radiculopathy, cervical region: Secondary | ICD-10-CM | POA: Diagnosis not present

## 2018-05-27 DIAGNOSIS — G894 Chronic pain syndrome: Secondary | ICD-10-CM | POA: Diagnosis not present

## 2018-08-10 DIAGNOSIS — M47812 Spondylosis without myelopathy or radiculopathy, cervical region: Secondary | ICD-10-CM | POA: Diagnosis not present

## 2018-08-10 DIAGNOSIS — M2550 Pain in unspecified joint: Secondary | ICD-10-CM | POA: Diagnosis not present

## 2018-08-10 DIAGNOSIS — G894 Chronic pain syndrome: Secondary | ICD-10-CM | POA: Diagnosis not present

## 2018-08-10 DIAGNOSIS — M797 Fibromyalgia: Secondary | ICD-10-CM | POA: Diagnosis not present

## 2018-08-10 DIAGNOSIS — M47816 Spondylosis without myelopathy or radiculopathy, lumbar region: Secondary | ICD-10-CM | POA: Diagnosis not present

## 2018-08-19 ENCOUNTER — Telehealth: Payer: Self-pay | Admitting: Family Medicine

## 2018-08-19 NOTE — Telephone Encounter (Signed)
pts husband dropped off form to be filled out for handicap sticker renewal placed in forms box at nurse desk.

## 2018-11-03 ENCOUNTER — Encounter: Payer: Self-pay | Admitting: Gastroenterology

## 2018-11-09 DIAGNOSIS — M47816 Spondylosis without myelopathy or radiculopathy, lumbar region: Secondary | ICD-10-CM | POA: Diagnosis not present

## 2018-11-09 DIAGNOSIS — M797 Fibromyalgia: Secondary | ICD-10-CM | POA: Diagnosis not present

## 2018-11-09 DIAGNOSIS — M2550 Pain in unspecified joint: Secondary | ICD-10-CM | POA: Diagnosis not present

## 2018-11-09 DIAGNOSIS — Z79891 Long term (current) use of opiate analgesic: Secondary | ICD-10-CM | POA: Diagnosis not present

## 2018-11-09 DIAGNOSIS — M47812 Spondylosis without myelopathy or radiculopathy, cervical region: Secondary | ICD-10-CM | POA: Diagnosis not present

## 2018-11-09 DIAGNOSIS — G894 Chronic pain syndrome: Secondary | ICD-10-CM | POA: Diagnosis not present

## 2018-12-30 ENCOUNTER — Encounter: Payer: Self-pay | Admitting: Family Medicine

## 2018-12-30 ENCOUNTER — Ambulatory Visit (INDEPENDENT_AMBULATORY_CARE_PROVIDER_SITE_OTHER): Payer: Medicare Other | Admitting: Family Medicine

## 2018-12-30 VITALS — BP 132/84 | Temp 98.3°F | Ht 66.0 in

## 2018-12-30 DIAGNOSIS — J019 Acute sinusitis, unspecified: Secondary | ICD-10-CM | POA: Diagnosis not present

## 2018-12-30 MED ORDER — DOXYCYCLINE HYCLATE 100 MG PO TABS: 100.0000 mg | ORAL_TABLET | Freq: Two times a day (BID) | ORAL | 0 refills | Status: DC

## 2018-12-30 NOTE — Progress Notes (Signed)
Subjective:    Patient ID: [name], female    DOB: 04-12-1951, 68 y.o.   MRN: [id number]  Cough  This is a new problem. Episode onset: dec 25th. Pertinent negatives include no ear pain, fever, sore throat, shortness of breath or wheezing. Associated symptoms comments: Congestion, .   Reports cough/congestion x 1 month. Reports noticed at times spots of blood when she coughed - happened a couple times a couple weeks ago. Tried to get in touch with Dr. Elsworth Soho her pulmonologist, but was unable to. No fever. Felt very fatigued initially - but feels better now, still having cough and congestion. Feels like it is improving. Has tried taking nyquil and benadryl - helped some.   Cough got better after about 2 weeks. Last week felt like head congestion came out of nowhere.   Reports having a lot of nausea from MS.   Would like to change from xanax to ativan. Has been taking xanax one a day for 12 years. Then last year was told she could not take with pain med so now takes occasionaly. Pt states she last filled in September. Goes to pain management. Would like something to take for nerves and nausea. Has nausea due to MS.    Review of Systems  Constitutional: Negative for fever.  HENT: Positive for congestion and sinus pressure. Negative for ear pain and sore throat.   Respiratory: Positive for cough. Negative for shortness of breath and wheezing.        Objective:   Physical Exam Vitals signs and nursing note reviewed.  Constitutional:      General: She is not in acute distress.    Appearance: Normal appearance. She is not toxic-appearing.  HENT:     Head: Normocephalic and atraumatic.     Right Ear: Tympanic membrane normal.     Left Ear: Tympanic membrane normal.     Nose: Congestion present.     Mouth/Throat:     Mouth: Mucous membranes are moist.     Pharynx: Oropharynx is clear.  Eyes:     General:        Right eye: No discharge.        Left eye: No discharge.  Neck:   Musculoskeletal: Neck supple. No neck rigidity.  Cardiovascular:     Rate and Rhythm: Normal rate and regular rhythm.     Heart sounds: Normal heart sounds.  Pulmonary:     Effort: Pulmonary effort is normal. No respiratory distress.     Breath sounds: Normal breath sounds. No wheezing or rales.  Lymphadenopathy:     Cervical: No cervical adenopathy.  Skin:    General: Skin is warm and dry.  Neurological:     Mental Status: She is alert.           Assessment & Plan:  Acute rhinosinusitis  Likely post-viral etiology. Will cover with abx. Symptomatic care discussed. Encouraged patient to follow-up with her pulmonologist regarding her cough with blood in sputum a couple weeks ago. If this reoccurs recommend chest x-ray at that time. Warning signs discussed. If symptoms worsen or fail to improve she should f/u.  Also discussed that patient should contact her MS specialist regarding the nausea that she feels is related to her MS. She states she is not seeing anyone for MS currently because she did not like any of the providers she has seen. Pt states she does not want to see anyone for this at this time.   Lengthy discussion  with patient regarding risks associated with concurrent benzodiazepine use and opioid pain medications. Stated I do not recommend this combination of medications and would not be prescribing that today. If she wants to come in for a separate office visit to discuss her anxiety and management of that we would be happy to see her for this.   Dr. Mickie Hillier was consulted on this case and is in agreement with the above treatment plan.

## 2019-04-04 ENCOUNTER — Telehealth: Payer: Self-pay | Admitting: Family Medicine

## 2019-04-04 NOTE — Telephone Encounter (Signed)
Dentist put her on Clindamycin Friday for   Doxycyclen.

## 2019-04-05 ENCOUNTER — Other Ambulatory Visit: Payer: Self-pay

## 2019-04-05 ENCOUNTER — Encounter: Payer: Self-pay | Admitting: Family Medicine

## 2019-04-05 ENCOUNTER — Ambulatory Visit (INDEPENDENT_AMBULATORY_CARE_PROVIDER_SITE_OTHER): Payer: Medicare Other | Admitting: Family Medicine

## 2019-04-05 DIAGNOSIS — J019 Acute sinusitis, unspecified: Secondary | ICD-10-CM

## 2019-04-05 MED ORDER — MAGIC MOUTHWASH: 15.0000 mL | Freq: Four times a day (QID) | ORAL | 0 refills | Status: DC

## 2019-04-05 MED ORDER — CLINDAMYCIN HCL 150 MG PO CAPS: 150.0000 mg | ORAL_CAPSULE | Freq: Four times a day (QID) | ORAL | 0 refills | Status: DC

## 2019-04-05 NOTE — Progress Notes (Signed)
   Subjective:    Patient ID: [name], female    DOB: 11-05-1951, 68 y.o.   MRN: [id number] Audio only HPI Patient states she started having pain in left side of face and she went to the dentist. The dentist gave her an antibiotic Friday to see if it would help but it has not and it is affecting her ability to talk well.  Virtual Visit via Video Note  I connected with [name] on 04/05/19 at 11:00 AM EDT by a video enabled telemedicine application and verified that I am speaking with the correct person using two identifiers.  Location: Patient: home Provider: office   I discussed the limitations of evaluation and management by telemedicine and the availability of in person appointments. The patient expressed understanding and agreed to proceed.  History of Present Illness:    Observations/Objective:   Assessment and Plan:   Follow Up Instructions:    I discussed the assessment and treatment plan with the patient. The patient was provided an opportunity to ask questions and all were answered. The patient agreed with the plan and demonstrated an understanding of the instructions.   The patient was advised to call back or seek an in-person evaluation if the symptoms worsen or if the condition fails to improve as anticipated.  I provided 18 minutes of non-face-to-face time during this encounter.   Patient notes left-sided facial pain.  Accompanied by pain in the gums.  Painful when chewing.  No recent congestion.  Was diagnosed as having possible sinus versus dental issue by her dentist.  Given only 8 clindamycin tablets.  Reports minimal improvement with this.  Pain sharp at times.  Known history of MS.  Followed by specialist.  Takes 4 hydrocodone tens per day    Review of Systems No headache, no major weight loss or weight gain, no chest pain no back pain abdominal pain no change in bowel habits complete ROS otherwise negative     Objective:   Physical Exam  Virtual visit       Assessment & Plan:  Impression 1 odontogenic pain versus sinus pain versus neuropathy facial nerve.  Will give full 10-day trial of clindamycin 150 4 times daily 10 days.  Patient to use probiotics.  Add Dukes Magic mouthwash.  If pain persists recommend back to her MS specialist for consideration of painful neuropathy

## 2019-04-05 NOTE — Telephone Encounter (Signed)
Patient scheduled for phone visit per nurse. 04/05/19 at 11am.

## 2019-04-14 ENCOUNTER — Telehealth: Payer: Self-pay | Admitting: Family Medicine

## 2019-04-14 MED ORDER — CLINDAMYCIN HCL 150 MG PO CAPS: 150.0000 mg | ORAL_CAPSULE | Freq: Four times a day (QID) | ORAL | 0 refills | Status: DC

## 2019-04-14 NOTE — Telephone Encounter (Signed)
Prescription sent electronically to pharmacy. Patient notified. 

## 2019-04-14 NOTE — Telephone Encounter (Signed)
Currently on Cleocin 150mg  QID for 10 days

## 2019-04-14 NOTE — Telephone Encounter (Signed)
Still having mouth pain.  Has been getting better but still having pain in mouth and nose so thinks she needs to be on antibiotic a little longer.  Will take her last pill tomorrow.  [facility]

## 2019-04-14 NOTE — Telephone Encounter (Signed)
Ten moredays

## 2019-05-27 ENCOUNTER — Other Ambulatory Visit: Payer: Self-pay

## 2019-05-27 ENCOUNTER — Ambulatory Visit (INDEPENDENT_AMBULATORY_CARE_PROVIDER_SITE_OTHER): Payer: Medicare Other | Admitting: Nurse Practitioner

## 2019-05-27 DIAGNOSIS — J320 Chronic maxillary sinusitis: Secondary | ICD-10-CM | POA: Diagnosis not present

## 2019-05-27 NOTE — Progress Notes (Signed)
   Subjective:  PHONE CALL   Patient ID: [name], female    DOB: [birth year], 68 y.o.   MRN: [id number]  Sinus Problem This is a chronic problem. The current episode started more than 1 month ago.   Patient states it got better with recent antibiotics given in may but now has returned. Patient also needs to get in touch with her lung specialist because it is time for them to repeat her CT scan. Patient also said it is time for her MS specialist to do another MRI on her head.   Virtual Visit via Video Note  I connected with [name] on 05/27/19 at  2:00 PM EDT by a video enabled telemedicine application and verified that I am speaking with the correct person using two identifiers.  Location: Patient: home Provider: office   I discussed the limitations of evaluation and management by telemedicine and the availability of in person appointments. The patient expressed understanding and agreed to proceed.  History of Present Illness: Presents for complaints of recurrent sinus symptoms.  Recently took 2 rounds of clindamycin which finally resolved her symptoms.  Was off the antibiotic for 2 weeks and now has had symptoms again for about a week and a half.  No fever.  No sore throat.  No ear pain.  Tenderness continues to be around the maxillary area.  Has had a dental visit to rule out any tooth problems.  Take some Benadryl or antihistamine daily.  States she cannot use any nasal sprays.  Clindamycin was taken but caused a lot of stomach upset. A review of speech notes from 04/05/2019 shows a similar problem, the impression was but odontogenic versus sinus versus facial nerve neuropathy.   Observations/Objective: Today's visit was via telephone Physical exam was not possible for this visit Alert, oriented.  Thoughts logical coherent and relevant.  Assessment and Plan: Problem List Items Addressed This Visit    None    Visit Diagnoses    Chronic maxillary sinusitis    -  Primary    Relevant Orders   CT Maxillofacial WO CM       Follow Up Instructions: CT scan of the sinuses without contrast ordered.  Concerns at this point about repeated antibiotic use without a definitive diagnosis.  Continue taking antihistamines as directed.  Encourage nasal spray use but patient defers. Patient to follow-up with Dr. Elsworth Soho for her repeat lung scan and checkup. Follow-up with Dr. Felecia Shelling for her MS checkup. Further follow-up based on CT scan results.  Patient to call back sooner if symptoms worsen.   I discussed the assessment and treatment plan with the patient. The patient was provided an opportunity to ask questions and all were answered. The patient agreed with the plan and demonstrated an understanding of the instructions.   The patient was advised to call back or seek an in-person evaluation if the symptoms worsen or if the condition fails to improve as anticipated.  I provided 15 minutes of non-face-to-face time during this encounter.       Review of Systems     Objective:   Physical Exam        Assessment & Plan:

## 2019-05-28 ENCOUNTER — Encounter: Payer: Self-pay | Admitting: Nurse Practitioner

## 2019-06-07 ENCOUNTER — Ambulatory Visit (HOSPITAL_COMMUNITY): Payer: Medicare Other

## 2020-01-05 ENCOUNTER — Encounter: Payer: Self-pay | Admitting: Family Medicine

## 2020-07-20 ENCOUNTER — Other Ambulatory Visit: Payer: Self-pay

## 2020-07-20 ENCOUNTER — Encounter: Payer: Self-pay | Admitting: Family Medicine

## 2020-07-20 ENCOUNTER — Telehealth (INDEPENDENT_AMBULATORY_CARE_PROVIDER_SITE_OTHER): Payer: Medicare Other | Admitting: Family Medicine

## 2020-07-20 ENCOUNTER — Telehealth: Payer: Self-pay | Admitting: Family Medicine

## 2020-07-20 DIAGNOSIS — R5383 Other fatigue: Secondary | ICD-10-CM

## 2020-07-20 DIAGNOSIS — Z79899 Other long term (current) drug therapy: Secondary | ICD-10-CM

## 2020-07-20 DIAGNOSIS — E7800 Pure hypercholesterolemia, unspecified: Secondary | ICD-10-CM | POA: Diagnosis not present

## 2020-07-20 DIAGNOSIS — Z1321 Encounter for screening for nutritional disorder: Secondary | ICD-10-CM

## 2020-07-20 DIAGNOSIS — Z85118 Personal history of other malignant neoplasm of bronchus and lung: Secondary | ICD-10-CM

## 2020-07-20 DIAGNOSIS — G35 Multiple sclerosis: Secondary | ICD-10-CM

## 2020-07-20 DIAGNOSIS — M810 Age-related osteoporosis without current pathological fracture: Secondary | ICD-10-CM

## 2020-07-20 DIAGNOSIS — J019 Acute sinusitis, unspecified: Secondary | ICD-10-CM

## 2020-07-20 DIAGNOSIS — Z87891 Personal history of nicotine dependence: Secondary | ICD-10-CM

## 2020-07-20 MED ORDER — CLINDAMYCIN HCL 150 MG PO CAPS: 150.0000 mg | ORAL_CAPSULE | Freq: Three times a day (TID) | ORAL | 0 refills | Status: AC

## 2020-07-20 NOTE — Progress Notes (Signed)
Patient ID: [name], female    DOB: [birth year], 69 y.o.   MRN: [id number]   Chief Complaint  Patient presents with  . sores in mouth    Similar to what patient experienced last year. Sores in mouth for several days. Took a few doses of Doxycycline that she had left from last year.    Subjective:  Patient verified who she was with two identifiers. Virtual Visit via Telephone Note  I connected with [name] on 07/20/20 at  8:20 AM EDT by telephone and verified that I am speaking with the correct person using two identifiers.  Location: Patient: home Provider: office   I discussed the limitations, risks, security and privacy concerns of performing an evaluation and management service by telephone and the availability of in person appointments. I also discussed with the patient that there may be a patient responsible charge related to this service. The patient expressed understanding and agreed to proceed.   History of Present Illness: I spoke with [name] via telephone and she identified herself with 2 identifiers. Reports she has right side of face and ear pain. This has happened before. I tried to get her to come to the office for a car visit so I could assess her. She is unable to do that due to her MS and fear of Covid infection. No fever/chills. Able to swallow water. Reports that Cindamycin has helped this in the past and she tolerates this medication well.   Observations/Objective: Reports that she has some tenderness over frontal and maxillary sinuses.   Assessment and Plan: See below  Follow Up Instructions: I stressed firmly that we need to get current lab work on her so we can have all the proper data in order to treat her safely. She reports that she will stop by the lab within two weeks to get labs when she is out of the house for her MRI. She also wanted her yearly CT scan ordered for her history of lung cancer.    I discussed the assessment and treatment  plan with the patient. The patient was provided an opportunity to ask questions and all were answered. The patient agreed with the plan and demonstrated an understanding of the instructions.   The patient was advised to call back or seek an in-person evaluation if the symptoms worsen or if the condition fails to improve as anticipated.  I provided 30 minutes of non-face-to-face time during this encounter.   Chalmers Guest, NP    HPI Right side of face pain consistent with acute rhino sinusitis symptoms she has had in the past. Denies fever, headache, or chills.  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   Outpatient Encounter Medications as of 07/20/2020  Medication Sig  . ALPRAZolam (XANAX) 0.5 MG tablet Take 1 tablet (0.5 mg total) by mouth at bedtime as needed for sleep.  . clindamycin (CLEOCIN) 150 MG capsule Take 1 capsule (150 mg total) by mouth 3 (three) times daily for 10 days.  . diphenhydrAMINE (BENADRYL) 25 MG tablet Take 25 mg by mouth every 6 (six) hours as needed. Patient takes 1 tablet at night.  Marland Kitchen HYDROcodone-acetaminophen (NORCO) 10-325 MG tablet Take 1 tablet by mouth every 6 (six) hours as needed. Max 4 per day  . magic  mouthwash SOLN Take 15 mLs by mouth 4 (four) times daily. Gargle and spit (Patient not taking: Reported on 07/20/2020)   No facility-administered encounter medications on file as of 07/20/2020.     Review of Systems  Constitutional: Negative for chills and fever.  HENT: Positive for ear pain, sinus pressure, sinus pain and sore throat. Negative for hearing loss, mouth sores, rhinorrhea and trouble swallowing.   Respiratory: Negative.   Cardiovascular: Negative.   Gastrointestinal:  Negative.   Genitourinary: Negative.   Musculoskeletal: Negative.      Vitals There were no vitals taken for this visit.  Objective:   Physical Exam Unable to do hands on exam (requested patient to come to office for car visit- she is unable to accommodate this request due to lack of time to make plans for this today). States she is tender over frontal and maxillary sinuses (per her palpation).    Assessment and Plan   1. Acute rhinosinusitis - clindamycin (CLEOCIN) 150 MG capsule; Take 1 capsule (150 mg total) by mouth 3 (three) times daily for 10 days.  Dispense: 30 capsule; Refill: 0  2. History of lung cancer - CT Chest Wo Contrast  3. Former cigarette smoker - CT Chest Wo Contrast  4. HYPERCHOLESTEROLEMIA - Lipid panel  5. MULTIPLE SCLEROSIS - Comprehensive Metabolic Panel (CMET) - VITAMIN D 25 Hydroxy (Vit-D Deficiency, Fractures)  6. Fatigue, unspecified type - CBC with Differential/Platelet - Comprehensive Metabolic Panel (CMET) - TSH - VITAMIN D 25 Hydroxy (Vit-D Deficiency, Fractures)  7. High risk medication use - CBC with Differential/Platelet - Comprehensive Metabolic Panel (CMET) - TSH - VITAMIN D 25 Hydroxy (Vit-D Deficiency, Fractures)  8. Encounter for vitamin deficiency screening - VITAMIN D 25 Hydroxy (Vit-D Deficiency, Fractures)  9. Osteoporosis, unspecified osteoporosis type, unspecified pathological fracture presence   Reiterated with patient the importance of lab work and face to face follow-up with her primary, Dr. Lovena Le. She agrees to get labs drawn and I requested the front desk to schedule an appointment with her.  She agrees with this plan of care. If her sinusitis does not clear, I will need her to come to the office for evaluation before additional antibiotics can be prescribed.   Will notify her of results once they become available.  Agrees with plan of care discussed today. Understands warning signs to seek further care:  fever, increasing pain. Understands she needs a face-to-face visit ASAP!    Pecolia Ades, NP

## 2020-07-24 ENCOUNTER — Encounter: Payer: Self-pay | Admitting: Family Medicine

## 2020-08-16 ENCOUNTER — Ambulatory Visit (HOSPITAL_COMMUNITY): Payer: Medicare Other

## 2020-09-04 ENCOUNTER — Ambulatory Visit: Payer: Medicare Other | Admitting: Family Medicine

## 2020-10-02 ENCOUNTER — Ambulatory Visit (HOSPITAL_COMMUNITY): Payer: Medicare Other

## 2021-01-08 DIAGNOSIS — M4316 Spondylolisthesis, lumbar region: Secondary | ICD-10-CM | POA: Diagnosis not present

## 2021-01-08 DIAGNOSIS — M2550 Pain in unspecified joint: Secondary | ICD-10-CM | POA: Diagnosis not present

## 2021-01-08 DIAGNOSIS — Z79891 Long term (current) use of opiate analgesic: Secondary | ICD-10-CM | POA: Diagnosis not present

## 2021-01-08 DIAGNOSIS — M797 Fibromyalgia: Secondary | ICD-10-CM | POA: Diagnosis not present

## 2021-01-08 DIAGNOSIS — G894 Chronic pain syndrome: Secondary | ICD-10-CM | POA: Diagnosis not present

## 2021-01-08 DIAGNOSIS — M47816 Spondylosis without myelopathy or radiculopathy, lumbar region: Secondary | ICD-10-CM | POA: Diagnosis not present

## 2021-03-11 ENCOUNTER — Other Ambulatory Visit: Payer: Self-pay

## 2021-03-11 ENCOUNTER — Ambulatory Visit (HOSPITAL_COMMUNITY)
Admission: RE | Admit: 2021-03-11 | Discharge: 2021-03-11 | Disposition: A | Discharge status: Home / Self Care | Payer: Medicare Other | Source: Ambulatory Visit | Attending: Family Medicine | Admitting: Family Medicine

## 2021-03-11 DIAGNOSIS — Z87891 Personal history of nicotine dependence: Secondary | ICD-10-CM | POA: Insufficient documentation

## 2021-03-11 DIAGNOSIS — I2510 Atherosclerotic heart disease of native coronary artery without angina pectoris: Secondary | ICD-10-CM | POA: Diagnosis not present

## 2021-03-11 DIAGNOSIS — Z85118 Personal history of other malignant neoplasm of bronchus and lung: Secondary | ICD-10-CM | POA: Diagnosis not present

## 2021-03-11 DIAGNOSIS — J984 Other disorders of lung: Secondary | ICD-10-CM | POA: Diagnosis not present

## 2021-03-11 DIAGNOSIS — K449 Diaphragmatic hernia without obstruction or gangrene: Secondary | ICD-10-CM | POA: Diagnosis not present

## 2021-04-08 DIAGNOSIS — M47816 Spondylosis without myelopathy or radiculopathy, lumbar region: Secondary | ICD-10-CM | POA: Diagnosis not present

## 2021-04-08 DIAGNOSIS — M2550 Pain in unspecified joint: Secondary | ICD-10-CM | POA: Diagnosis not present

## 2021-04-08 DIAGNOSIS — G894 Chronic pain syndrome: Secondary | ICD-10-CM | POA: Diagnosis not present

## 2021-04-08 DIAGNOSIS — M4316 Spondylolisthesis, lumbar region: Secondary | ICD-10-CM | POA: Diagnosis not present

## 2021-04-08 DIAGNOSIS — M797 Fibromyalgia: Secondary | ICD-10-CM | POA: Diagnosis not present

## 2021-04-26 ENCOUNTER — Ambulatory Visit (INDEPENDENT_AMBULATORY_CARE_PROVIDER_SITE_OTHER): Payer: Medicare Other | Admitting: Nurse Practitioner

## 2021-04-26 ENCOUNTER — Other Ambulatory Visit: Payer: Self-pay

## 2021-04-26 ENCOUNTER — Encounter: Payer: Self-pay | Admitting: Nurse Practitioner

## 2021-04-26 VITALS — BP 120/76 | HR 70 | Temp 97.9°F | Ht 66.0 in

## 2021-04-26 DIAGNOSIS — E7800 Pure hypercholesterolemia, unspecified: Secondary | ICD-10-CM | POA: Diagnosis not present

## 2021-04-26 DIAGNOSIS — Z1211 Encounter for screening for malignant neoplasm of colon: Secondary | ICD-10-CM | POA: Diagnosis not present

## 2021-04-26 DIAGNOSIS — K219 Gastro-esophageal reflux disease without esophagitis: Secondary | ICD-10-CM | POA: Diagnosis not present

## 2021-04-26 DIAGNOSIS — M810 Age-related osteoporosis without current pathological fracture: Secondary | ICD-10-CM | POA: Diagnosis not present

## 2021-04-26 DIAGNOSIS — Z1231 Encounter for screening mammogram for malignant neoplasm of breast: Secondary | ICD-10-CM

## 2021-04-26 DIAGNOSIS — Z1159 Encounter for screening for other viral diseases: Secondary | ICD-10-CM

## 2021-04-26 DIAGNOSIS — Z780 Asymptomatic menopausal state: Secondary | ICD-10-CM

## 2021-04-26 DIAGNOSIS — I498 Other specified cardiac arrhythmias: Secondary | ICD-10-CM | POA: Diagnosis not present

## 2021-04-26 DIAGNOSIS — Z79899 Other long term (current) drug therapy: Secondary | ICD-10-CM | POA: Diagnosis not present

## 2021-04-26 NOTE — Progress Notes (Signed)
Subjective:    Patient ID: [name], female    DOB: 05-12-1951, 70 y.o.   MRN: [id number]  HPIdiscuss CT results.   Having nausea. Off and on for awhile. Thinks its related to MS.   Reflux issues and heart fluttering.  Occasional nausea.  Also occasional "spasm" in the upper epigastric area near the lower sternum.  Nausea is relieved with Zofran.  Drinks coffee frequently, no documented amount.  No excessive NSAID use.  No tobacco or alcohol use.  When asked what she takes for her reflux she states "I eat a lot of Tums".  No regular medication for reflux, patient defers.  Patient states she will eat and then she lays down.  Is also due for her routine colonoscopy.  No change in the color of her stools.  Has a history of a small hiatal hernia. Complaints of extreme fatigue which she relates to her MS.  States she is in bed 20 out of 24 hours a day.  Her main activity is taking care of her 3 dogs. States she has been having palpitations for over 30 years.  Occurs every few months.  Describes as a "wavering" sensation.  Can last anywhere from hours to days.  Has not identified any specific triggers.  No chest pain/ischemic type pain or shortness of breath associated with these episodes.  No syncopal episodes.  No exacerbating factors. Sees specialist for pain management and regular follow-up with pulmonology.     Objective:   Physical Exam NAD.  Alert, oriented.  Moderately anxious affect.  Making good eye contact.  Dressed appropriately.  Speech clear.  Lungs clear.  Heart regular rate rhythm.  No murmur or gallop noted.  Carotids no bruits or thrills.  Abdomen mildly obese soft nondistended with active bowel sounds.  Mild generalized tenderness but more noted in the epigastric area.  No rebound or guarding.  No obvious masses. Reviewed CT scan of the chest without contrast dated 03/12/2021.  Lungs are stable but moderate coronary artery calcification/aortic atherosclerosis was noted. Today's  Vitals   04/26/21 1339  BP: 120/76  Pulse: 70  Temp: 97.9 F (36.6 C)  SpO2: 96%  Height: 5\' 6"  (1.676 m)   Body mass index is 37.53 kg/m. EKG: NSR     Assessment & Plan:   Problem List Items Addressed This Visit      Digestive   GERD (gastroesophageal reflux disease)   Relevant Orders   Ambulatory referral to Gastroenterology     Musculoskeletal and Integument   Osteoporosis   Relevant Orders   VITAMIN D 25 Hydroxy (Vit-D Deficiency, Fractures)   DG Bone Density     Other   HYPERCHOLESTEROLEMIA   Relevant Orders   Lipid panel    Other Visit Diagnoses    Fluttering heart    -  Primary   Relevant Orders   EKG 12-Lead (Completed)   Comprehensive metabolic panel   CBC with Differential/Platelet   TSH   Ambulatory referral to Cardiology   Encounter for hepatitis C screening test for low risk patient       Relevant Orders   Hepatitis C antibody   High risk medication use       Relevant Orders   Comprehensive metabolic panel   CBC with Differential/Platelet   Screening mammogram for breast cancer       Relevant Orders   MS SCREENING BREAST W/IMPLANT TOMO UNI R   MS SCREENING BREAST W/IMPLANT TOMO UNI L   Post-menopausal  Relevant Orders   DG Bone Density   Screen for colon cancer       Relevant Orders   Ambulatory referral to Gastroenterology     Patient defers medication for GERD at this time.  Continue OTC meds as directed until she sees gastroenterologist.  Patient is not willing to give up coffee but recommend that she cut back on the amount is much as possible or try decaffeinated beverages. Her last labs were from 2017.  Labs were ordered August 2021 but these were not obtained.   Explained results regarding atherosclerosis on her CT scan, recommend she consider cholesterol medication depending on the results of her blood work.  Strongly encourage patient to get these done soon as possible.  Patient also requests scheduling her mammogram and bone  density. Referral to cardiology for evaluation of chronic recurrent palpitations.  Call or go to ED in the meantime if any new or worsening symptoms. Recommend wellness exam in the near future. Return in about 6 months (around 10/27/2021) for Recommend wellness exam.

## 2021-04-28 ENCOUNTER — Encounter: Payer: Self-pay | Admitting: Nurse Practitioner

## 2021-04-30 ENCOUNTER — Telehealth: Payer: Self-pay | Admitting: *Deleted

## 2021-04-30 MED ORDER — ONDANSETRON 4 MG PO TBDP: 4.0000 mg | ORAL_TABLET | Freq: Three times a day (TID) | ORAL | 0 refills | Status: DC | PRN

## 2021-04-30 NOTE — Telephone Encounter (Signed)
Pt notified zofran sent in and message sent to carolyn for the hydroxyzine. Pt states carolyn told her to use this med instead of benadryl for when she itches. States she feels like something crawling on her at times. Pt aware carolyn out of office and will let her know when carolyn sends in med

## 2021-04-30 NOTE — Telephone Encounter (Signed)
Pt seen Friday. She states she was suppose to get something for nausea and hydroxyzine. She said pharm did not receive anything.  [facility] [place]

## 2021-04-30 NOTE — Telephone Encounter (Signed)
[name] requested message go to provider since she is out of town

## 2021-05-03 ENCOUNTER — Other Ambulatory Visit: Payer: Self-pay | Admitting: Nurse Practitioner

## 2021-05-03 MED ORDER — HYDROXYZINE PAMOATE 25 MG PO CAPS: 25.0000 mg | ORAL_CAPSULE | Freq: Three times a day (TID) | ORAL | 2 refills | Status: DC | PRN

## 2021-05-03 NOTE — Telephone Encounter (Signed)
Pt.notified

## 2021-05-03 NOTE — Telephone Encounter (Signed)
Med sent in. Thanks.

## 2021-05-06 ENCOUNTER — Ambulatory Visit (HOSPITAL_COMMUNITY): Payer: Medicare Other

## 2021-05-06 ENCOUNTER — Encounter: Payer: Self-pay | Admitting: *Deleted

## 2021-05-06 ENCOUNTER — Other Ambulatory Visit (HOSPITAL_COMMUNITY): Payer: Medicare Other

## 2021-05-08 ENCOUNTER — Ambulatory Visit (HOSPITAL_COMMUNITY): Payer: Medicare Other

## 2021-05-08 ENCOUNTER — Other Ambulatory Visit (HOSPITAL_COMMUNITY): Payer: Medicare Other

## 2021-05-16 NOTE — Telephone Encounter (Signed)
error 

## 2021-06-12 ENCOUNTER — Ambulatory Visit: Payer: Medicare Other | Admitting: Internal Medicine
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
## 2021-07-02 ENCOUNTER — Encounter: Payer: Self-pay | Admitting: Cardiovascular Disease

## 2021-07-02 ENCOUNTER — Other Ambulatory Visit: Payer: Self-pay

## 2021-07-02 ENCOUNTER — Ambulatory Visit (INDEPENDENT_AMBULATORY_CARE_PROVIDER_SITE_OTHER): Payer: Medicare Other | Admitting: Cardiovascular Disease

## 2021-07-02 VITALS — BP 144/60 | HR 70 | Ht 66.0 in | Wt 211.0 lb

## 2021-07-02 DIAGNOSIS — R002 Palpitations: Secondary | ICD-10-CM | POA: Diagnosis not present

## 2021-07-02 DIAGNOSIS — G894 Chronic pain syndrome: Secondary | ICD-10-CM | POA: Diagnosis not present

## 2021-07-02 DIAGNOSIS — I2510 Atherosclerotic heart disease of native coronary artery without angina pectoris: Secondary | ICD-10-CM

## 2021-07-02 DIAGNOSIS — G35 Multiple sclerosis: Secondary | ICD-10-CM | POA: Diagnosis not present

## 2021-07-02 DIAGNOSIS — C3412 Malignant neoplasm of upper lobe, left bronchus or lung: Secondary | ICD-10-CM

## 2021-07-02 MED ORDER — PROPRANOLOL HCL 10 MG PO TABS: 10.0000 mg | ORAL_TABLET | Freq: Every day | ORAL | 11 refills | Status: DC | PRN

## 2021-07-02 NOTE — Patient Instructions (Signed)
Medication Instructions:  Start Propranolol 10 mg as needed   *If you need a refill on your cardiac medications before your next appointment, please call your pharmacy*   Lab Work: NONE   If you have labs (blood work) drawn today and your tests are completely normal, you will receive your results only by: [place] (if you have MyChart) OR A paper copy in the mail If you have any lab test that is abnormal or we need to change your treatment, we will call you to review the results.   Testing/Procedures: NONE    Follow-Up: At [facility], you and your health needs are our priority.  As part of our continuing mission to provide you with exceptional heart care, we have created designated Provider Care Teams.  These Care Teams include your primary Cardiologist (physician) and Advanced Practice Providers (APPs -  Physician Assistants and Nurse Practitioners) who all work together to provide you with the care you need, when you need it.  We recommend signing up for the patient portal called "MyChart".  Sign up information is provided on this After Visit Summary.  MyChart is used to connect with patients for Virtual Visits (Telemedicine).  Patients are able to view lab/test results, encounter notes, upcoming appointments, etc.  Non-urgent messages can be sent to your provider as well.   To learn more about what you can do with MyChart, go to [email or web address].    Your next appointment:    As Needed   The format for your next appointment:   In Person  Provider:   Jenkins Rouge, MD   Other Instructions Thank you for choosing [facility]!

## 2021-07-30 ENCOUNTER — Ambulatory Visit (INDEPENDENT_AMBULATORY_CARE_PROVIDER_SITE_OTHER): Payer: Medicare Other | Admitting: Family Medicine

## 2021-07-30 ENCOUNTER — Telehealth: Payer: Self-pay

## 2021-07-30 ENCOUNTER — Other Ambulatory Visit: Payer: Self-pay

## 2021-07-30 VITALS — Ht 66.0 in | Wt 211.0 lb

## 2021-07-30 DIAGNOSIS — K1379 Other lesions of oral mucosa: Secondary | ICD-10-CM | POA: Diagnosis not present

## 2021-07-30 NOTE — Progress Notes (Signed)
I connected with  [name] on 07/30/21 by a phone enabled telemedicine application and verified that I am speaking with the correct person using two identifiers.   I discussed the limitations of evaluation and management by telemedicine. The patient expressed understanding and agreed to proceed.  Patient location: home  Provider location: in office  I provided 11 minutes of non face - to - face time during this encounter.   Patient ID: [name], female    DOB: 1951/08/01, 70 y.o.   MRN: [id number]   Chief Complaint  Patient presents with   Sinus Problem    Facial pain , took benadryl today   Subjective:    HPI Pt stating having some mouth and sinus concerns.  Sharp pain from nose to mouth.  States it starts above the canine tooth and radiates to her nose.  Took benadryl and not helping much. Benadryl helps with her "crawling skin " feeling with her MS. Pain shoots from tooth up to her sinuses.  Last year seen by NP and given meds. -no fever or  drainage from sinuses.   Was given clindamycin in past.  Thinking this started in 2020.  In 8/21- had sinusitis and was given clinamycin.  Pt has chronic mouth pain from MS, said has it all the time.   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   Outpatient Encounter Medications as of 07/30/2021  Medication Sig   ALPRAZolam (XANAX) 0.5 MG tablet    calcium citrate (CALCITRATE - DOSED IN MG ELEMENTAL CALCIUM) 950 (200 Ca) MG tablet Take by mouth.   diphenhydrAMINE (BENADRYL) 25 MG tablet Take 25 mg by mouth every 6 (six) hours as needed. Patient takes 1 tablet at night.   HYDROcodone-acetaminophen (NORCO) 10-325 MG tablet Take 1 tablet by mouth every 6 (six)  hours as needed. Max 4 per day   Lecithin 1200 MG CAPS Take by mouth.   Magnesium 200 MG TABS Take by mouth.   hydrOXYzine (VISTARIL) 25 MG capsule Take 1 capsule (25 mg total) by mouth every 8 (eight) hours as needed. Do not take with Benadryl; drowsiness precautions (Patient not taking: Reported on 07/30/2021)   ondansetron (ZOFRAN ODT) 4 MG disintegrating tablet Take 1 tablet (4 mg total) by mouth every 8 (eight) hours as needed for nausea or vomiting. (Patient not taking: Reported on 07/30/2021)   propranolol (INDERAL) 10 MG tablet Take 1 tablet (10 mg total) by mouth daily as needed (Palpitations). (Patient not taking: Reported on 07/30/2021)   No facility-administered encounter medications on file as of 07/30/2021.     Review of Systems  Constitutional:  Negative for chills and fever.  HENT:  Negative for congestion, rhinorrhea, sinus pressure, sinus pain and sore throat.        Mouth pain radiating to nose.  Respiratory:  Negative for cough, shortness of breath and wheezing.   Cardiovascular:  Negative for chest pain and leg swelling.  Gastrointestinal:  Negative for abdominal pain, diarrhea, nausea and vomiting.  Genitourinary:  Negative for dysuria and frequency.  Musculoskeletal:  Negative for arthralgias and back pain.  Skin:  Negative for rash.  Neurological:  Negative for dizziness, weakness and headaches.    Vitals Ht 5\' 6"  (1.676 m)   Wt 211 lb (95.7 kg)   BMI 34.06  kg/m   Objective:   Physical Exam No PE due to phone visit.   Assessment and Plan   1. Mouth pain   Advising pt needing in person visit for her mouth/sinus concern. Has only been telemedicine in the past 2 yrs for this concern. Pt has never been seen by me.  Pt stating not wanting to leave the house. Recommended pt come in for a visit to evaluate the mouth pain further.  Pt declined, "stating she's not leaving the house." Advising that she should see a specialist or go to urgent care for the mouth  pain.  Pt declined.  I advised pt not comfortable prescribing abx over the phone for mouth pain and that it didn't sound like a sinus infection.  Return if symptoms worsen or fail to improve.

## 2021-07-30 NOTE — Telephone Encounter (Signed)
I connected with  [name] on 07/30/21 by a video enabled telemedicine application and verified that I am speaking with the correct person using two identifiers.   I discussed the limitations of evaluation and management by telemedicine. The patient expressed understanding and agreed to proceed.

## 2021-07-31 ENCOUNTER — Ambulatory Visit: Payer: Medicare Other | Admitting: Internal Medicine

## 2021-08-12 DIAGNOSIS — G894 Chronic pain syndrome: Secondary | ICD-10-CM | POA: Diagnosis not present

## 2021-08-12 DIAGNOSIS — M4316 Spondylolisthesis, lumbar region: Secondary | ICD-10-CM | POA: Diagnosis not present

## 2021-08-12 DIAGNOSIS — M2550 Pain in unspecified joint: Secondary | ICD-10-CM | POA: Diagnosis not present

## 2021-08-12 DIAGNOSIS — Z79891 Long term (current) use of opiate analgesic: Secondary | ICD-10-CM | POA: Diagnosis not present

## 2021-08-12 DIAGNOSIS — M47816 Spondylosis without myelopathy or radiculopathy, lumbar region: Secondary | ICD-10-CM | POA: Diagnosis not present

## 2021-08-12 DIAGNOSIS — M797 Fibromyalgia: Secondary | ICD-10-CM | POA: Diagnosis not present

## 2021-08-16 ENCOUNTER — Ambulatory Visit: Payer: Medicare Other | Admitting: Nurse Practitioner

## 2021-11-11 DIAGNOSIS — M47816 Spondylosis without myelopathy or radiculopathy, lumbar region: Secondary | ICD-10-CM | POA: Diagnosis not present

## 2021-11-11 DIAGNOSIS — G894 Chronic pain syndrome: Secondary | ICD-10-CM | POA: Diagnosis not present

## 2021-11-11 DIAGNOSIS — M4316 Spondylolisthesis, lumbar region: Secondary | ICD-10-CM | POA: Diagnosis not present

## 2021-11-11 DIAGNOSIS — M2550 Pain in unspecified joint: Secondary | ICD-10-CM | POA: Diagnosis not present

## 2021-11-11 DIAGNOSIS — M797 Fibromyalgia: Secondary | ICD-10-CM | POA: Diagnosis not present

## 2022-02-05 DIAGNOSIS — Z79891 Long term (current) use of opiate analgesic: Secondary | ICD-10-CM | POA: Diagnosis not present

## 2022-02-05 DIAGNOSIS — Z5181 Encounter for therapeutic drug level monitoring: Secondary | ICD-10-CM | POA: Diagnosis not present

## 2022-02-05 DIAGNOSIS — G894 Chronic pain syndrome: Secondary | ICD-10-CM | POA: Diagnosis not present

## 2022-02-05 DIAGNOSIS — M4316 Spondylolisthesis, lumbar region: Secondary | ICD-10-CM | POA: Diagnosis not present

## 2022-02-05 DIAGNOSIS — M2550 Pain in unspecified joint: Secondary | ICD-10-CM | POA: Diagnosis not present

## 2022-02-05 DIAGNOSIS — M47816 Spondylosis without myelopathy or radiculopathy, lumbar region: Secondary | ICD-10-CM | POA: Diagnosis not present

## 2022-02-05 DIAGNOSIS — M797 Fibromyalgia: Secondary | ICD-10-CM | POA: Diagnosis not present

## 2022-04-30 DIAGNOSIS — G894 Chronic pain syndrome: Secondary | ICD-10-CM | POA: Diagnosis not present

## 2022-04-30 DIAGNOSIS — M2550 Pain in unspecified joint: Secondary | ICD-10-CM | POA: Diagnosis not present

## 2022-04-30 DIAGNOSIS — M47816 Spondylosis without myelopathy or radiculopathy, lumbar region: Secondary | ICD-10-CM | POA: Diagnosis not present

## 2022-04-30 DIAGNOSIS — M797 Fibromyalgia: Secondary | ICD-10-CM | POA: Diagnosis not present

## 2022-04-30 DIAGNOSIS — M4316 Spondylolisthesis, lumbar region: Secondary | ICD-10-CM | POA: Diagnosis not present

## 2022-06-17 ENCOUNTER — Other Ambulatory Visit: Payer: Self-pay

## 2022-06-17 ENCOUNTER — Emergency Department (HOSPITAL_COMMUNITY): Payer: Medicare Other

## 2022-06-17 ENCOUNTER — Encounter (HOSPITAL_COMMUNITY): Payer: Self-pay

## 2022-06-17 DIAGNOSIS — R519 Headache, unspecified: Secondary | ICD-10-CM | POA: Insufficient documentation

## 2022-06-17 DIAGNOSIS — I10 Essential (primary) hypertension: Secondary | ICD-10-CM | POA: Diagnosis not present

## 2022-06-17 DIAGNOSIS — R531 Weakness: Secondary | ICD-10-CM | POA: Diagnosis not present

## 2022-06-17 DIAGNOSIS — R911 Solitary pulmonary nodule: Secondary | ICD-10-CM | POA: Diagnosis not present

## 2022-06-17 DIAGNOSIS — R29810 Facial weakness: Secondary | ICD-10-CM | POA: Diagnosis not present

## 2022-06-17 NOTE — ED Triage Notes (Signed)
Complaining of high blood pressure, high heart rate and pain in the left side of face.  The face pain has been going on for 4 years and she has been told it is her ms.  High blood pressure has been going on today

## 2022-06-18 ENCOUNTER — Other Ambulatory Visit: Payer: Self-pay

## 2022-06-18 ENCOUNTER — Emergency Department (HOSPITAL_COMMUNITY)
Admission: EM | Admit: 2022-06-18 | Discharge: 2022-06-18 | Disposition: A | Discharge status: Home / Self Care | Payer: Medicare Other | Attending: Emergency Medicine | Admitting: Emergency Medicine

## 2022-06-18 DIAGNOSIS — R911 Solitary pulmonary nodule: Secondary | ICD-10-CM | POA: Diagnosis not present

## 2022-06-18 DIAGNOSIS — I10 Essential (primary) hypertension: Secondary | ICD-10-CM | POA: Diagnosis not present

## 2022-06-18 DIAGNOSIS — R531 Weakness: Secondary | ICD-10-CM | POA: Diagnosis not present

## 2022-06-18 DIAGNOSIS — R519 Headache, unspecified: Secondary | ICD-10-CM | POA: Diagnosis not present

## 2022-06-18 LAB — CBC WITH DIFFERENTIAL/PLATELET
Abs Immature Granulocytes: 0.03 10*3/uL (ref 0.00–0.07)
Basophils Absolute: 0.1 10*3/uL (ref 0.0–0.1)
Basophils Relative: 1 %
Eosinophils Absolute: 0.1 10*3/uL (ref 0.0–0.5)
Eosinophils Relative: 1 %
HCT: 42.1 % (ref 36.0–46.0)
Hemoglobin: 13.5 g/dL (ref 12.0–15.0)
Immature Granulocytes: 0 %
Lymphocytes Relative: 41 %
Lymphs Abs: 2.9 10*3/uL (ref 0.7–4.0)
MCH: 30.1 pg (ref 26.0–34.0)
MCHC: 32.1 g/dL (ref 30.0–36.0)
MCV: 93.8 fL (ref 80.0–100.0)
Monocytes Absolute: 0.5 10*3/uL (ref 0.1–1.0)
Monocytes Relative: 7 %
Neutro Abs: 3.5 10*3/uL (ref 1.7–7.7)
Neutrophils Relative %: 50 %
Platelets: 173 10*3/uL (ref 150–400)
RBC: 4.49 MIL/uL (ref 3.87–5.11)
RDW: 13.6 % (ref 11.5–15.5)
WBC: 7 10*3/uL (ref 4.0–10.5)
nRBC: 0 % (ref 0.0–0.2)

## 2022-06-18 LAB — COMPREHENSIVE METABOLIC PANEL
ALT: 19 U/L (ref 0–44)
AST: 20 U/L (ref 15–41)
Albumin: 4.5 g/dL (ref 3.5–5.0)
Alkaline Phosphatase: 40 U/L (ref 38–126)
Anion gap: 6 (ref 5–15)
BUN: 28 mg/dL — ABNORMAL HIGH (ref 8–23)
CO2: 27 mmol/L (ref 22–32)
Calcium: 8.9 mg/dL (ref 8.9–10.3)
Chloride: 109 mmol/L (ref 98–111)
Creatinine, Ser: 0.68 mg/dL (ref 0.44–1.00)
GFR, Estimated: >60 mL/min (ref >60)
Glucose, Bld: 100 mg/dL — ABNORMAL HIGH (ref 70–99)
Potassium: 3.8 mmol/L (ref 3.5–5.1)
Sodium: 142 mmol/L (ref 135–145)
Total Bilirubin: 0.4 mg/dL (ref 0.3–1.2)
Total Protein: 8 g/dL (ref 6.5–8.1)

## 2022-06-18 MED ORDER — HYDROMORPHONE HCL 1 MG/ML IJ SOLN: 1.0000 mg | Freq: Once | INTRAMUSCULAR | Status: DC

## 2022-06-18 MED ORDER — HYDROMORPHONE HCL 1 MG/ML IJ SOLN
1.0000 mg | Freq: Once | INTRAMUSCULAR | Status: AC
  Administered 2022-06-18: 1 mg via INTRAVENOUS
  Filled 2022-06-18: qty 1

## 2022-06-18 MED ORDER — METHYLPREDNISOLONE SODIUM SUCC 125 MG IJ SOLR
125.0000 mg | Freq: Once | INTRAMUSCULAR | Status: AC
  Administered 2022-06-18: 125 mg via INTRAVENOUS
  Filled 2022-06-18: qty 2

## 2022-06-18 MED ORDER — PREGABALIN 75 MG PO CAPS: 150.0000 mg | ORAL_CAPSULE | Freq: Two times a day (BID) | ORAL | 0 refills | Status: DC

## 2022-06-18 MED ORDER — OMEPRAZOLE 20 MG PO CPDR: 20.0000 mg | DELAYED_RELEASE_CAPSULE | Freq: Every day | ORAL | 0 refills | Status: DC

## 2022-06-18 MED ORDER — PREDNISONE 20 MG PO TABS: ORAL_TABLET | ORAL | 0 refills | Status: DC

## 2022-06-18 MED ORDER — OXYCODONE-ACETAMINOPHEN 5-325 MG PO TABS: 1.0000 | ORAL_TABLET | Freq: Four times a day (QID) | ORAL | 0 refills | Status: DC | PRN

## 2022-06-18 NOTE — ED Provider Notes (Signed)
[facility] EMERGENCY DEPARTMENT Provider Note   CSN: [id number] Arrival date & time: 06/17/22  2113     History  Chief Complaint  Patient presents with   Weakness    [name] is a 71 y.o. female.  Patient presents the ER today secondary to multiple symptoms.  Ultimately her main reason for coming she is having worsening pain in the left side of her face.  States she has a history of MS and states that one of her lesions did cause some weakness in her face in this area in the past.  She states that over the last week or 2 the pain is progressively worsened and her home Vicodin only worked for couple hours.  Lyrica helps a little bit as well.  Extra Tylenol does not help.  She states it is a sharp stabbing pain is seems to go from her mouth upwards towards her eye.  No other new deficits.  She also relates that she has had recent palpitations.  She also notes that her blood pressures been high with this worsening pain.  She has a log and she is documented blood pressures from 643 systolic to 329 systolic and all of them around 518 diastolic.  She states that she normally has low blood pressures but on further inspection she had multiple office visits her blood pressures have been pretty consistently in the 1 84-1 70 systolic range.  On further questioning she states that her low blood pressures went when she was younger but she still thinks that her normal blood pressure should be around 130.  No other new neurodeficits just her pre-existing ones from MS lesions.  No chest pain.  No shortness of breath or lower extremity swelling.  Normal urination.  No recent infections.   Weakness      Home Medications Prior to Admission medications   Medication Sig Start Date End Date Taking? Authorizing Provider  omeprazole (PRILOSEC) 20 MG capsule Take 1 capsule (20 mg total) by mouth daily for 14 days. 06/18/22 07/02/22 Yes Tawni Melkonian, Corene Cornea, MD  oxyCODONE-acetaminophen (PERCOCET/ROXICET) 5-325 MG  tablet Take 1 tablet by mouth every 6 (six) hours as needed for severe pain. 06/18/22  Yes Oronde Hallenbeck, Corene Cornea, MD  predniSONE (DELTASONE) 20 MG tablet 3 tabs po daily x 3 days, then 2 tabs x 3 days, then 1.5 tabs x 3 days, then 1 tab x 3 days, then 0.5 tabs x 3 days 06/18/22  Yes Kendle Turbin, Corene Cornea, MD  pregabalin (LYRICA) 75 MG capsule Take 2 capsules (150 mg total) by mouth 2 (two) times daily for 15 days. 06/18/22 07/03/22 Yes Pearlee Arvizu, Corene Cornea, MD  ALPRAZolam Duanne Moron) 0.5 MG tablet  08/29/17   [provider]  calcium citrate (CALCITRATE - DOSED IN MG ELEMENTAL CALCIUM) 950 (200 Ca) MG tablet Take by mouth.    [provider]  diphenhydrAMINE (BENADRYL) 25 MG tablet Take 25 mg by mouth every 6 (six) hours as needed. Patient takes 1 tablet at night.    [provider]  HYDROcodone-acetaminophen (NORCO) 10-325 MG tablet Take 1 tablet by mouth every 6 (six) hours as needed. Max 4 per day 11/12/17   Nilda Simmer, NP  hydrOXYzine (VISTARIL) 25 MG capsule Take 1 capsule (25 mg total) by mouth every 8 (eight) hours as needed. Do not take with Benadryl; drowsiness precautions Patient not taking: Reported on 07/30/2021 05/03/21   Nilda Simmer, NP  Lecithin 1200 MG CAPS Take by mouth.    [provider]  Magnesium 200 MG TABS Take by mouth.    [provider]  ondansetron (ZOFRAN ODT) 4 MG disintegrating tablet Take 1 tablet (4 mg total) by mouth every 8 (eight) hours as needed for nausea or vomiting. Patient not taking: Reported on 07/30/2021 04/30/21   Elvia Collum M, DO  propranolol (INDERAL) 10 MG tablet Take 1 tablet (10 mg total) by mouth daily as needed (Palpitations). Patient not taking: Reported on 07/30/2021 07/02/21   Josue Hector, MD      Allergies    Other    Review of Systems   Review of Systems  Neurological:  Positive for weakness.    Physical Exam Updated Vital Signs BP (!) 171/79   Pulse 86   Temp 98.2 F (36.8 C) (Oral)   Resp (!) 9   Ht  5\' 6"  (1.676 m)   Wt 101.6 kg   SpO2 94%   BMI 36.15 kg/m  Physical Exam Vitals and nursing note reviewed.  HENT:     Head: Normocephalic and atraumatic.     Comments: No obvious oral infections or abscessed tooth.    Mouth/Throat:     Mouth: Mucous membranes are moist.  Eyes:     Pupils: Pupils are equal, round, and reactive to light.  Pulmonary:     Effort: Pulmonary effort is normal.  Abdominal:     General: Abdomen is flat.  Skin:    General: Skin is warm and dry.  Neurological:     Comments: Aside from sensation changes in the left face no obvious facial asymmetry.     ED Results / Procedures / Treatments   Labs (all labs ordered are listed, but only abnormal results are displayed) Labs Reviewed  COMPREHENSIVE METABOLIC PANEL - Abnormal; Notable for the following components:      Result Value   Glucose, Bld 100 (*)    BUN 28 (*)    All other components within normal limits  CBC WITH DIFFERENTIAL/PLATELET    EKG None  Radiology DG Chest 2 View  Result Date: 06/18/2022 CLINICAL DATA:  Weakness, high blood pressure. History of MS and lung cancer. EXAM: CHEST - 2 VIEW COMPARISON:  04/11/2008. FINDINGS: The heart size and mediastinal contours are within normal limits. There is atherosclerotic calcification of the aorta. Surgical changes are noted in the mid left lung. The previously described pulmonary nodules are not seen on this exam. No consolidation, effusion, or pneumothorax. An old healed rib fracture is noted on the left. No acute osseous abnormality. IMPRESSION: No active cardiopulmonary disease. Electronically Signed   By: Brett Fairy M.D.   On: 06/18/2022 00:04   CT Head Wo Contrast  Result Date: 06/18/2022 CLINICAL DATA:  Facial paralysis/weakness (CN 7). Left facial pain. Multiple sclerosis. EXAM: CT HEAD WITHOUT CONTRAST TECHNIQUE: Contiguous axial images were obtained from the base of the skull through the vertex without intravenous contrast. RADIATION  DOSE REDUCTION: This exam was performed according to the departmental dose-optimization program which includes automated exposure control, adjustment of the mA and/or kV according to patient size and/or use of iterative reconstruction technique. COMPARISON:  None Available. FINDINGS: Brain: Normal anatomic configuration. No abnormal intra or extra-axial mass lesion or fluid collection. No abnormal mass effect or midline shift. No evidence of acute intracranial hemorrhage or infarct. Ventricular size is normal. Cerebellum unremarkable. Vascular: Unremarkable Skull: Intact Sinuses/Orbits: Mucous retention cyst within the right sphenoid sinus. Remaining paranasal sinuses are clear. Orbits are unremarkable. Other: Mastoid air cells and middle ear cavities are  clear. IMPRESSION: 1. No acute intracranial abnormality. 2. Right sphenoid sinus disease. Electronically Signed   By: Fidela Salisbury M.D.   On: 06/18/2022 00:01    Procedures Procedures    Medications Ordered in ED Medications  HYDROmorphone (DILAUDID) injection 1 mg (1 mg Intravenous Given 06/18/22 0113)  methylPREDNISolone sodium succinate (SOLU-MEDROL) 125 mg/2 mL injection 125 mg (125 mg Intravenous Given 06/18/22 0114)  HYDROmorphone (DILAUDID) injection 1 mg (1 mg Intravenous Given 06/18/22 0517)    ED Course/ Medical Decision Making/ A&P                           Medical Decision Making Amount and/or Complexity of Data Reviewed Labs: ordered. Radiology: ordered. ECG/medicine tests: ordered.  Risk Prescription drug management.   Ultimately pain was managed with steroids and Dilaudid.  Discussed with Dr. Milas Gain with neurology at [facility] who did not recommend specific MS treatment and secondary to her age and that she needs to follow-up with her outpatient neurologist.  Patient also follow-up with outpatient cardiologist about her palpitations that she does have propranolol at home and the palpitations are not happening very regularly.   No evidence of stroke.  CT scan without any acute changes.  She will follow-up with her pain doctor for further pain management but will increase her Lyrica and send her home on a prednisone taper which should help if she has trigeminal neuralgia or MS flare.   Final Clinical Impression(s) / ED Diagnoses Final diagnoses:  Facial pain    Rx / DC Orders ED Discharge Orders          Ordered    oxyCODONE-acetaminophen (PERCOCET/ROXICET) 5-325 MG tablet  Every 6 hours PRN        06/18/22 0504    predniSONE (DELTASONE) 20 MG tablet        06/18/22 0506    omeprazole (PRILOSEC) 20 MG capsule  Daily        06/18/22 0506    pregabalin (LYRICA) 75 MG capsule  2 times daily        06/18/22 0506              Haaris Metallo, Corene Cornea, MD 06/18/22 2318

## 2022-06-18 NOTE — ED Notes (Signed)
Edp in room  

## 2022-06-19 ENCOUNTER — Other Ambulatory Visit: Payer: Self-pay

## 2022-06-19 ENCOUNTER — Emergency Department (HOSPITAL_COMMUNITY)
Admission: EM | Admit: 2022-06-19 | Discharge: 2022-06-19 | Disposition: A | Discharge status: Home / Self Care | Payer: Medicare Other | Attending: Emergency Medicine | Admitting: Emergency Medicine

## 2022-06-19 DIAGNOSIS — I4891 Unspecified atrial fibrillation: Secondary | ICD-10-CM | POA: Diagnosis not present

## 2022-06-19 DIAGNOSIS — G500 Trigeminal neuralgia: Secondary | ICD-10-CM | POA: Insufficient documentation

## 2022-06-19 DIAGNOSIS — R519 Headache, unspecified: Secondary | ICD-10-CM | POA: Diagnosis present

## 2022-06-19 LAB — BASIC METABOLIC PANEL
Anion gap: 12 (ref 5–15)
BUN: 29 mg/dL — ABNORMAL HIGH (ref 8–23)
CO2: 22 mmol/L (ref 22–32)
Calcium: 9.6 mg/dL (ref 8.9–10.3)
Chloride: 107 mmol/L (ref 98–111)
Creatinine, Ser: 0.78 mg/dL (ref 0.44–1.00)
GFR, Estimated: >60 mL/min (ref >60)
Glucose, Bld: 126 mg/dL — ABNORMAL HIGH (ref 70–99)
Potassium: 4.2 mmol/L (ref 3.5–5.1)
Sodium: 141 mmol/L (ref 135–145)

## 2022-06-19 LAB — CBC
HCT: 41.2 % (ref 36.0–46.0)
Hemoglobin: 13.6 g/dL (ref 12.0–15.0)
MCH: 29.9 pg (ref 26.0–34.0)
MCHC: 33 g/dL (ref 30.0–36.0)
MCV: 90.5 fL (ref 80.0–100.0)
Platelets: 201 10*3/uL (ref 150–400)
RBC: 4.55 MIL/uL (ref 3.87–5.11)
RDW: 13.6 % (ref 11.5–15.5)
WBC: 13 10*3/uL — ABNORMAL HIGH (ref 4.0–10.5)
nRBC: 0 % (ref 0.0–0.2)

## 2022-06-19 MED ORDER — SODIUM CHLORIDE 0.9 % IV SOLN
1000.0000 mg | Freq: Once | INTRAVENOUS | Status: AC
  Administered 2022-06-19: 1000 mg via INTRAVENOUS
  Filled 2022-06-19: qty 20

## 2022-06-19 MED ORDER — CARBAMAZEPINE 200 MG PO TABS
200.0000 mg | ORAL_TABLET | Freq: Once | ORAL | Status: AC
  Administered 2022-06-19: 200 mg via ORAL
  Filled 2022-06-19 (×2): qty 1

## 2022-06-19 MED ORDER — PROPRANOLOL HCL 1 MG/ML IV SOLN: 2.0000 mg | Freq: Once | INTRAVENOUS | Status: DC

## 2022-06-19 MED ORDER — LABETALOL HCL 5 MG/ML IV SOLN: 10.0000 mg | Freq: Once | INTRAVENOUS | Status: DC

## 2022-06-19 MED ORDER — METOPROLOL SUCCINATE ER 25 MG PO TB24: 25.0000 mg | ORAL_TABLET | Freq: Every day | ORAL | 0 refills | Status: DC

## 2022-06-19 MED ORDER — CARBAMAZEPINE 200 MG PO TABS: 200.0000 mg | ORAL_TABLET | Freq: Two times a day (BID) | ORAL | 0 refills | Status: DC

## 2022-06-19 MED ORDER — METOPROLOL SUCCINATE ER 25 MG PO TB24
25.0000 mg | ORAL_TABLET | Freq: Once | ORAL | Status: AC
  Administered 2022-06-19: 25 mg via ORAL
  Filled 2022-06-19: qty 1

## 2022-06-19 MED ORDER — ASPIRIN 81 MG PO CHEW: 324.0000 mg | CHEWABLE_TABLET | Freq: Every day | ORAL | 0 refills | Status: DC

## 2022-06-19 MED ORDER — HYDROMORPHONE HCL 1 MG/ML IJ SOLN
2.0000 mg | Freq: Once | INTRAMUSCULAR | Status: AC
  Administered 2022-06-19: 2 mg via INTRAVENOUS
  Filled 2022-06-19: qty 2

## 2022-06-19 MED ORDER — METOPROLOL TARTRATE 5 MG/5ML IV SOLN
10.0000 mg | Freq: Once | INTRAVENOUS | Status: AC
  Administered 2022-06-19: 10 mg via INTRAVENOUS
  Filled 2022-06-19: qty 10

## 2022-06-19 MED FILL — Oxycodone w/ Acetaminophen Tab 5-325 MG: ORAL | Qty: 6 | Status: AC

## 2022-06-19 NOTE — ED Provider Triage Note (Signed)
Emergency Medicine Provider Triage Evaluation Note  [name] , a 71 y.o. female  was evaluated in triage.  Pt complains of left-sided facial pain.  Pain is transient, sharp, and severe.  Was recently seen at [facility] with similar symptoms and states he had relief following IV pain medication for a brief period of time however reports ongoing symptoms since then.  She has history of MS.  States this is typical for MS and has been ongoing for years.  Denies any new neurological deficits.   Review of Systems  Positive: As above Negative: As above  Physical Exam  BP (!) 205/101   Pulse 93   Temp 98.1 F (36.7 C) (Oral)   Resp (!) 22   SpO2 98%  Gen:   Awake, no distress   Resp:  Normal effort  MSK:   Moves extremities without difficulty  Other:    Medical Decision Making  Medically screening exam initiated at 4:16 PM.  Appropriate orders placed.  [name] was informed that the remainder of the evaluation will be completed by another provider, this initial triage assessment does not replace that evaluation, and the importance of remaining in the ED until their evaluation is complete.     Yolanda Courier, PA-C 06/19/22 [phone]

## 2022-06-19 NOTE — ED Provider Notes (Signed)
[facility] EMERGENCY DEPARTMENT Provider Note   CSN: [id number] Arrival date & time: 06/19/22  1521     History  Chief Complaint  Patient presents with   facial nerve pain    [name] is a 71 y.o. female history of multiple sclerosis, here presenting with left facial pain.  Patient has been having shooting pain of the left face going on for the last several months.  Patient does have chronic pain and is on Vicodin at baseline and also Lyrica.  Patient states that the pain is sharp and very severe.  She states that the vicodin has not been helping with the pain.  She denies any blurry vision or focal weakness or numbness.  Of note patient does have a history of multiple sclerosis.  She has not seen her neurologist for several years and did not have an MRI for several years now.  Patient went to [facility] 2 days ago and the provider discussed with neurology and patient was diagnosed with trigeminal neuralgia.  She was put on prednisone and her Lyrica dose was doubled to 150 mg twice daily.  She states that despite the increasing dose, she has persistent pain.  Continues to deny any blurry vision or weakness or numbness  The history is provided by the patient.       Home Medications Prior to Admission medications   Medication Sig Start Date End Date Taking? Authorizing Provider  ALPRAZolam Duanne Moron) 0.5 MG tablet  08/29/17   [provider]  calcium citrate (CALCITRATE - DOSED IN MG ELEMENTAL CALCIUM) 950 (200 Ca) MG tablet Take by mouth.    [provider]  diphenhydrAMINE (BENADRYL) 25 MG tablet Take 25 mg by mouth every 6 (six) hours as needed. Patient takes 1 tablet at night.    [provider]  HYDROcodone-acetaminophen (NORCO) 10-325 MG tablet Take 1 tablet by mouth every 6 (six) hours as needed. Max 4 per day 11/12/17   Nilda Simmer, NP  hydrOXYzine (VISTARIL) 25 MG capsule Take 1 capsule (25 mg total) by mouth every 8  (eight) hours as needed. Do not take with Benadryl; drowsiness precautions Patient not taking: Reported on 07/30/2021 05/03/21   Nilda Simmer, NP  Lecithin 1200 MG CAPS Take by mouth.    [provider]  Magnesium 200 MG TABS Take by mouth.    [provider]  omeprazole (PRILOSEC) 20 MG capsule Take 1 capsule (20 mg total) by mouth daily for 14 days. 06/18/22 07/02/22  Mesner, Corene Cornea, MD  ondansetron (ZOFRAN ODT) 4 MG disintegrating tablet Take 1 tablet (4 mg total) by mouth every 8 (eight) hours as needed for nausea or vomiting. Patient not taking: Reported on 07/30/2021 04/30/21   Elvia Collum M, DO  oxyCODONE-acetaminophen (PERCOCET/ROXICET) 5-325 MG tablet Take 1 tablet by mouth every 6 (six) hours as needed for severe pain. 06/18/22   Mesner, Corene Cornea, MD  predniSONE (DELTASONE) 20 MG tablet 3 tabs po daily x 3 days, then 2 tabs x 3 days, then 1.5 tabs x 3 days, then 1 tab x 3 days, then 0.5 tabs x 3 days 06/18/22   Mesner, Corene Cornea, MD  pregabalin (LYRICA) 75 MG capsule Take 2 capsules (150 mg total) by mouth 2 (two) times daily for 15 days. 06/18/22 07/03/22  Mesner, Corene Cornea, MD  propranolol (INDERAL) 10 MG tablet Take 1 tablet (10 mg total) by mouth daily as needed (Palpitations). Patient not taking: Reported on 07/30/2021 07/02/21   Johnsie Cancel,  Wallis Bamberg, MD      Allergies    Other    Review of Systems   Review of Systems  HENT:         Left facial pain  All other systems reviewed and are negative.   Physical Exam Updated Vital Signs BP (!) 165/89   Pulse 80   Temp 98.1 F (36.7 C) (Oral)   Resp 20   SpO2 99%  Physical Exam Vitals and nursing note reviewed.  Constitutional:      Comments: Uncomfortable, crying in pain  HENT:     Head: Normocephalic.     Right Ear: Tympanic membrane normal.     Left Ear: Tympanic membrane normal.     Nose: Nose normal.     Mouth/Throat:     Mouth: Mucous membranes are moist.  Eyes:     Extraocular Movements: Extraocular movements  intact.     Pupils: Pupils are equal, round, and reactive to light.  Cardiovascular:     Rate and Rhythm: Normal rate and regular rhythm.     Pulses: Normal pulses.     Heart sounds: Normal heart sounds.  Pulmonary:     Effort: Pulmonary effort is normal.     Breath sounds: Normal breath sounds.  Abdominal:     General: Abdomen is flat.     Palpations: Abdomen is soft.  Musculoskeletal:        General: Normal range of motion.     Cervical back: Normal range of motion and neck supple.  Skin:    General: Skin is warm.     Capillary Refill: Capillary refill takes less than 2 seconds.  Neurological:     Comments: Has tenderness to light touch of the left face.  Patient has no obvious facial droop.  Patient has normal sensation of the face and bilateral arms and legs.  Patient has normal strength bilateral arms and patient's leg strength is 4 out of 5 bilaterally (chronic)  Psychiatric:        Mood and Affect: Mood normal.        Behavior: Behavior normal.     ED Results / Procedures / Treatments   Labs (all labs ordered are listed, but only abnormal results are displayed) Labs Reviewed  CBC - Abnormal; Notable for the following components:      Result Value   WBC 13.0 (*)    All other components within normal limits  BASIC METABOLIC PANEL - Abnormal; Notable for the following components:   Glucose, Bld 126 (*)    BUN 29 (*)    All other components within normal limits    EKG None  Radiology DG Chest 2 View  Result Date: 06/18/2022 CLINICAL DATA:  Weakness, high blood pressure. History of MS and lung cancer. EXAM: CHEST - 2 VIEW COMPARISON:  04/11/2008. FINDINGS: The heart size and mediastinal contours are within normal limits. There is atherosclerotic calcification of the aorta. Surgical changes are noted in the mid left lung. The previously described pulmonary nodules are not seen on this exam. No consolidation, effusion, or pneumothorax. An old healed rib fracture is noted  on the left. No acute osseous abnormality. IMPRESSION: No active cardiopulmonary disease. Electronically Signed   By: Brett Fairy M.D.   On: 06/18/2022 00:04   CT Head Wo Contrast  Result Date: 06/18/2022 CLINICAL DATA:  Facial paralysis/weakness (CN 7). Left facial pain. Multiple sclerosis. EXAM: CT HEAD WITHOUT CONTRAST TECHNIQUE: Contiguous axial images were obtained from the base of  the skull through the vertex without intravenous contrast. RADIATION DOSE REDUCTION: This exam was performed according to the departmental dose-optimization program which includes automated exposure control, adjustment of the mA and/or kV according to patient size and/or use of iterative reconstruction technique. COMPARISON:  None Available. FINDINGS: Brain: Normal anatomic configuration. No abnormal intra or extra-axial mass lesion or fluid collection. No abnormal mass effect or midline shift. No evidence of acute intracranial hemorrhage or infarct. Ventricular size is normal. Cerebellum unremarkable. Vascular: Unremarkable Skull: Intact Sinuses/Orbits: Mucous retention cyst within the right sphenoid sinus. Remaining paranasal sinuses are clear. Orbits are unremarkable. Other: Mastoid air cells and middle ear cavities are clear. IMPRESSION: 1. No acute intracranial abnormality. 2. Right sphenoid sinus disease. Electronically Signed   By: Fidela Salisbury M.D.   On: 06/18/2022 00:01    Procedures Procedures    Medications Ordered in ED Medications  fosPHENYtoin (CEREBYX) 1,000 mg PE in sodium chloride 0.9 % 50 mL IVPB (has no administration in time range)  carbamazepine (TEGRETOL) tablet 200 mg (has no administration in time range)  HYDROmorphone (DILAUDID) injection 2 mg (2 mg Intravenous Given 06/19/22 1926)    ED Course/ Medical Decision Making/ A&P                           Medical Decision Making DACOTA DEVALL is a 71 y.o. female here presenting with left facial pain.  Patient has multiple sclerosis but I do  not think this is a multiple sclerosis flare.  Patient was seen 2 days ago and was given prescription of prednisone and also on Lyrica.  Patient is mainly here for pain control.  I discussed case with Dr. Regis Bill from neurology.  He states that he would recommend pain medicine and also loaded up with fosphenytoin and tried to use carbamazepine 200 mg twice daily outpatient.  No need for MRI currently.   10:53 PM Patient's facial pain is better.  She then had palpitations.  Patient states that she has history of palpitations and is on Inderal.  She has seen cardiology in [place] previously.  Her heart rate went up to the 130s.  I obtained an EKG initially and it showed possible A-fib.  She was never diagnosed with atrial fibrillation.  Her Chadvasc score is 2.  I discussed with pharmacy.  Unfortunately, carbamazepine will interact with Xarelto or Eliquis.  Patient can only get Lovenox with Coumadin.  Since her [place] vas is 2 and the carbamazepine likely will be a short-term medication, I recommend that she follows up with cardiology and she may be able to start anticoagulation once she is done with carbamazepine.  After given lopressor, HR down to 101.  She is on Inderal as needed for palpitations and we will switch her to metoprolol XL and have her follow-up with cardiology.  She will need a chemistry check in 2 weeks as well since she is on carbamazepine to check her kidney function and her sodium  Problems Addressed: Atrial fibrillation, unspecified type [facility]): acute illness or injury Trigeminal neuralgia of left side of face: acute illness or injury  Amount and/or Complexity of Data Reviewed Labs: ordered. Decision-making details documented in ED Course. Radiology: ordered and independent interpretation performed. Decision-making details documented in ED Course. ECG/medicine tests: ordered and independent interpretation performed. Decision-making details documented in ED  Course.  Risk Prescription drug management.    Final Clinical Impression(s) / ED Diagnoses Final diagnoses:  None    Rx /  DC Orders ED Discharge Orders     None         Drenda Freeze, MD 06/19/22 [phone]

## 2022-06-19 NOTE — ED Triage Notes (Signed)
Pt is here for worsening L side facial nerve pain x2 weeks. Pt has hx of this in 2019 but had seen neurologist and was controlled through [facility], pt also has MS and chronic back pain. Pt was seen at [facility] on 7/18 for the same and was prescribed prednisone - pt states relief only lasted approx 30 min. Pt has tried multiple doses of home pain medication, tylenol, advil, lyrica w/ no relief. Episodes are lasting 15-30 second every 5-10 min.

## 2022-06-19 NOTE — Discharge Instructions (Addendum)
As we discussed, I recommend that you continue your pain medicine as prescribed by your pain doctor.  You also need to continue the Lyrica 150 mg twice daily as prescribed and also the prednisone as prescribed yesterday.  I have added Tegretol 200 mg twice daily.  You need to have your sodium level checked in 2 weeks  As we discussed, I cannot start a blood thinner with Tegretol.  I recommend you take aspirin 325 mg daily.  You may need to be on blood thinners after you are done with the Tegretol  I have referred you to back to see Dr. Johnsie Cancel from cardiology for follow-up for your atrial fibrillation  I have stopped your Inderal and I want you to take Toprol XL 25 mg daily.  Return to ER if you have severe facial pain, palpitations, chest pain

## 2022-06-19 NOTE — ED Notes (Signed)
Pt not in the room at this time.

## 2022-06-20 ENCOUNTER — Ambulatory Visit (INDEPENDENT_AMBULATORY_CARE_PROVIDER_SITE_OTHER): Payer: Medicare Other | Admitting: Family Medicine

## 2022-06-20 ENCOUNTER — Encounter: Payer: Self-pay | Admitting: Family Medicine

## 2022-06-20 VITALS — BP 141/83 | HR 61 | Temp 97.3°F | Wt 222.0 lb

## 2022-06-20 DIAGNOSIS — G500 Trigeminal neuralgia: Secondary | ICD-10-CM | POA: Diagnosis not present

## 2022-06-20 DIAGNOSIS — I2510 Atherosclerotic heart disease of native coronary artery without angina pectoris: Secondary | ICD-10-CM | POA: Diagnosis not present

## 2022-06-20 DIAGNOSIS — I700 Atherosclerosis of aorta: Secondary | ICD-10-CM | POA: Insufficient documentation

## 2022-06-20 DIAGNOSIS — E785 Hyperlipidemia, unspecified: Secondary | ICD-10-CM

## 2022-06-20 DIAGNOSIS — Z85118 Personal history of other malignant neoplasm of bronchus and lung: Secondary | ICD-10-CM

## 2022-06-20 DIAGNOSIS — R918 Other nonspecific abnormal finding of lung field: Secondary | ICD-10-CM

## 2022-06-20 DIAGNOSIS — G35 Multiple sclerosis: Secondary | ICD-10-CM | POA: Diagnosis not present

## 2022-06-20 DIAGNOSIS — G8929 Other chronic pain: Secondary | ICD-10-CM

## 2022-06-20 DIAGNOSIS — G894 Chronic pain syndrome: Secondary | ICD-10-CM

## 2022-06-20 DIAGNOSIS — I4891 Unspecified atrial fibrillation: Secondary | ICD-10-CM | POA: Diagnosis not present

## 2022-06-20 NOTE — Patient Instructions (Signed)
I am going to place the referrals. We will be in touch. If you have not heard anything in 1 week please let us know.  Continue the current medications. Do not mix the Hydrocodone and Oxycodone.  We will schedule the open MRI as well.  Take care  Dr. Lacinda Axon

## 2022-06-23 DIAGNOSIS — G500 Trigeminal neuralgia: Secondary | ICD-10-CM | POA: Insufficient documentation

## 2022-06-23 DIAGNOSIS — I480 Paroxysmal atrial fibrillation: Secondary | ICD-10-CM | POA: Insufficient documentation

## 2022-06-23 DIAGNOSIS — I4891 Unspecified atrial fibrillation: Secondary | ICD-10-CM | POA: Insufficient documentation

## 2022-06-23 NOTE — Assessment & Plan Note (Signed)
Currently patient is stable.  Continue Tegretol and Lyrica.

## 2022-06-23 NOTE — Assessment & Plan Note (Signed)
Referring to neurology.

## 2022-06-23 NOTE — Progress Notes (Signed)
Subjective:  Patient ID: [name], female    DOB: 06-Dec-1950  Age: 71 y.o. MRN: [id number]  CC: Chief Complaint  Patient presents with   Hospitalization Follow-up    Pt went to [facility] ER on 06/19/22 for facial nerve pain. Went to [employer] 2 days prior. Pt states this has been going for a while but has recently flared up again    HPI:  71 year old female with multiple sclerosis, chronic pain/chronic pain syndrome, osteoporosis, history of lung cancer, coronary artery disease (findings on CT chest in 2022 with coronary calcification), aortic atherosclerosis, anxiety presents for hospital follow-up.  Patient has been in the ER twice recently.  Has been diagnosed with trigeminal neuralgia.  Patient is on chronic narcotics secondary to chronic pain.  Has recently had increase in Lyrica and addition of Tegretol.  During the last ER visit, she was found to have atrial fibrillation.  She was not started on anticoagulation due to the fact that Tegretol and Xarelto/Eliquis are not compatible and it was thought that this will be a short-term medication.  Patient needs to see cardiology.  Patient also has not seen neurology in quite some time.  No current medications regarding MS.  Patient Active Problem List   Diagnosis Date Noted   Trigeminal neuralgia 06/23/2022   Atrial fibrillation ([place]) 06/23/2022   Multiple sclerosis ([place]) 06/20/2022   Chronic pain syndrome 06/20/2022   CAD (coronary artery disease) 06/20/2022   History of lung cancer 06/20/2022   Aortic atherosclerosis ([place]) 06/20/2022   Anxiety 06/28/2016   Osteoporosis 08/07/2015   GERD (gastroesophageal reflux disease) 08/14/2011    Social Hx   Social History   Socioeconomic History   Marital status: Married    Spouse name: Not on file   Number of children: 1   Years of education: Not on file   Highest education level: Not on file  Occupational History   Occupation: self-employed Therapist, occupational:  SELF-EMPLOYED  Tobacco Use   Smoking status: Former    Packs/day: 0.50    Types: Cigarettes   Smokeless tobacco: Never   Tobacco comments:    quit yrs ago  Vaping Use   Vaping Use: Never used  Substance and Sexual Activity   Alcohol use: No   Drug use: No   Sexual activity: Not on file  Other Topics Concern   Not on file  Social History Narrative   Not on file   Social Determinants of Health   Financial Resource Strain: Not on file  Food Insecurity: Not on file  Transportation Needs: Not on file  Physical Activity: Not on file  Stress: Not on file  Social Connections: Not on file    Review of Systems  HENT:         Facial pain.  Musculoskeletal:  Positive for arthralgias and myalgias.  Psychiatric/Behavioral:  The patient is nervous/anxious.    Objective:  BP (!) 141/83   Pulse 61   Temp (!) 97.3 F (36.3 C)   Wt 222 lb (100.7 kg)   SpO2 95%   BMI 35.83 kg/m      06/20/2022    3:53 PM 06/19/2022   11:15 PM 06/19/2022   11:11 PM  BP/Weight  Systolic BP [phone]  Diastolic BP 83 517 616  Wt. (Lbs) 222    BMI 35.83 kg/m2      Physical Exam Vitals and nursing note reviewed.  Constitutional:      General: She is  not in acute distress.    Appearance: She is obese.  HENT:     Head: Normocephalic and atraumatic.  Eyes:     General:        Right eye: No discharge.        Left eye: No discharge.     Conjunctiva/sclera: Conjunctivae normal.  Cardiovascular:     Comments: Irregularly irregular rhythm. Pulmonary:     Effort: Pulmonary effort is normal.     Breath sounds: Normal breath sounds.  Neurological:     Mental Status: She is alert.  Psychiatric:     Comments: Rapid speech.  Anxious.     Lab Results  Component Value Date   WBC 13.0 (H) 06/19/2022   HGB 13.6 06/19/2022   HCT 41.2 06/19/2022   PLT 201 06/19/2022   GLUCOSE 126 (H) 06/19/2022   CHOL 228 (H) 06/17/2016   TRIG 232 (H) 06/17/2016   HDL 47 06/17/2016   LDLCALC 135 (H)  06/17/2016   ALT 19 06/18/2022   AST 20 06/18/2022   NA 141 06/19/2022   K 4.2 06/19/2022   CL 107 06/19/2022   CREATININE 0.78 06/19/2022   BUN 29 (H) 06/19/2022   CO2 22 06/19/2022   TSH 2.900 06/17/2016   INR 0.9 11/04/2007     Assessment & Plan:   Problem List Items Addressed This Visit       Cardiovascular and Mediastinum   Atrial fibrillation [facility])    Referring to cardiology.  Continue metoprolol.      Relevant Orders   Ambulatory referral to Cardiology   CAD (coronary artery disease)   Relevant Orders   Ambulatory referral to Cardiology     Nervous and Auditory   Multiple sclerosis [facility]) - Primary    Referring to neurology.      Relevant Orders   Ambulatory referral to Neurology   Trigeminal neuralgia    Currently patient is stable.  Continue Tegretol and Lyrica.      Relevant Orders   Ambulatory referral to Neurology    Follow-up:  Return in about 1 month (around 07/21/2022).  [place]

## 2022-06-23 NOTE — Assessment & Plan Note (Signed)
Referring to cardiology.  Continue metoprolol.

## 2022-06-25 ENCOUNTER — Ambulatory Visit (INDEPENDENT_AMBULATORY_CARE_PROVIDER_SITE_OTHER): Payer: Medicare Other | Admitting: Cardiovascular Disease

## 2022-06-25 ENCOUNTER — Telehealth: Payer: Self-pay | Admitting: Cardiovascular Disease

## 2022-06-25 ENCOUNTER — Telehealth: Payer: Self-pay | Admitting: *Deleted

## 2022-06-25 ENCOUNTER — Encounter: Payer: Self-pay | Admitting: Cardiovascular Disease

## 2022-06-25 ENCOUNTER — Encounter: Payer: Self-pay | Admitting: Neurology

## 2022-06-25 VITALS — BP 138/80 | HR 109 | Ht 66.0 in | Wt 226.8 lb

## 2022-06-25 DIAGNOSIS — I2510 Atherosclerotic heart disease of native coronary artery without angina pectoris: Secondary | ICD-10-CM | POA: Diagnosis not present

## 2022-06-25 DIAGNOSIS — I480 Paroxysmal atrial fibrillation: Secondary | ICD-10-CM | POA: Diagnosis not present

## 2022-06-25 DIAGNOSIS — G500 Trigeminal neuralgia: Secondary | ICD-10-CM | POA: Diagnosis not present

## 2022-06-25 MED ORDER — METOPROLOL SUCCINATE ER 50 MG PO TB24: 50.0000 mg | ORAL_TABLET | Freq: Every day | ORAL | 3 refills | Status: DC

## 2022-06-25 NOTE — Telephone Encounter (Signed)
Patient's daughter Bonnie(DPR) calls with urgent concerns.  1)Daughter states the patient saw cardiology this afternoon because she is in Afib and they were very concerned she is unable to be on conventional blood thinners due to Tegretol Rx and was told she could have a stroke at anytime. Daughter states cardiology told them to reach out to PCP to coordinate with neurology to see what could be done for her to be able to start blood thinners ASAP- she states cardiology was going to put it in the note. 2) Daughter also states that her new referral to neurology yielded an appt in December and the cardiologist stated she needs to be seen ASAP by neurology in the next few weeks and PCP needed to try to move that appt up. 3) Lastly the daughter stated her mother is almost out of her Tegretol and needs a new script sent into the pharmacy.    Daughter would like call back with recommendations at [phone]
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
## 2022-06-25 NOTE — Telephone Encounter (Signed)
Patient is calling to check up on prescription. Says that they have one prescription but does not have the coumadin yet

## 2022-06-25 NOTE — Patient Instructions (Signed)
Medication Instructions:  Your physician has recommended you make the following change in your medication:   Increase Toprol XL to 50 mg Daily  Please check with insurance regarding [place]    *If you need a refill on your cardiac medications before your next appointment, please call your pharmacy*   Lab Work: NONE   If you have labs (blood work) drawn today and your tests are completely normal, you will receive your results only by: [place] (if you have MyChart) OR A paper copy in the mail If you have any lab test that is abnormal or we need to change your treatment, we will call you to review the results.   Testing/Procedures: NONE    Follow-Up: At [facility], you and your health needs are our priority.  As part of our continuing mission to provide you with exceptional heart care, we have created designated Provider Care Teams.  These Care Teams include your primary Cardiologist (physician) and Advanced Practice Providers (APPs -  Physician Assistants and Nurse Practitioners) who all work together to provide you with the care you need, when you need it.  We recommend signing up for the patient portal called "MyChart".  Sign up information is provided on this After Visit Summary.  MyChart is used to connect with patients for Virtual Visits (Telemedicine).  Patients are able to view lab/test results, encounter notes, upcoming appointments, etc.  Non-urgent messages can be sent to your provider as well.   To learn more about what you can do with MyChart, go to [email or web address].    Your next appointment:   3 month(s)  The format for your next appointment:   In Person  Provider:   Jenkins Rouge, MD    Other Instructions Thank you for choosing [facility]!    Important Information About Sugar

## 2022-06-26 ENCOUNTER — Ambulatory Visit: Payer: Medicare Other | Admitting: Internal Medicine

## 2022-06-26 ENCOUNTER — Other Ambulatory Visit: Payer: Self-pay | Admitting: *Deleted

## 2022-06-26 ENCOUNTER — Encounter: Payer: Self-pay | Admitting: Family Medicine

## 2022-06-26 MED ORDER — WARFARIN SODIUM 5 MG PO TABS: ORAL_TABLET | ORAL | 0 refills | Status: DC

## 2022-06-26 NOTE — Telephone Encounter (Signed)
Sending in in warfarin prescription.   Pt was seen by Dr. Johnsie Cancel 05/26/2022.  Per his note:    PAF:  ECG today shows persistence. Needs increased dose of Toprol 50 mg for better rate control. D/c ASA Discussed with [facility] [place] and Asencion Partridge [id number] also interacts to lesser degree than eliquis /xarelto and our best bet to protect her from stroke is coumadin Will start 5 mg daily F/U [place] coumadin clinic Monday At some point if she can come off Tegretol we can change to more traditional DOAC Eliquis / Xarelto     Prescription sent. Pt has appointment scheduled for Monday to have INR Checked.

## 2022-06-26 NOTE — Telephone Encounter (Addendum)
Called and spoke to pt and informed her that the prescription for warfarin was sent in.   Informed pt that warfarin is the generic name for coumadin. Informed pt to take 1 tablet by mouth daily and to come on Monday 06/30/2022 at the [place] office to have INR checked.   Informed pt that it is important to adhere to her INR visits to make sure she is on the appropriate dose of warfarin.   Informed pt her INR goal would be 2-3. If her INR is high she is at a higher risk for bleeding and is her INR is low she would be at a higher risk of developing a blood clots.   Informed pt that she will have to come weekly for the first 4 visits.

## 2022-06-27 ENCOUNTER — Ambulatory Visit (HOSPITAL_COMMUNITY)
Admission: RE | Admit: 2022-06-27 | Discharge: 2022-06-27 | Disposition: A | Discharge status: Home / Self Care | Payer: Medicare Other | Source: Ambulatory Visit | Attending: Family Medicine | Admitting: Family Medicine

## 2022-06-27 ENCOUNTER — Ambulatory Visit (INDEPENDENT_AMBULATORY_CARE_PROVIDER_SITE_OTHER): Payer: Medicare Other | Admitting: Family Medicine

## 2022-06-27 ENCOUNTER — Other Ambulatory Visit: Payer: Self-pay | Admitting: Family Medicine

## 2022-06-27 VITALS — BP 121/85 | HR 96 | Temp 97.4°F | Wt 227.0 lb

## 2022-06-27 DIAGNOSIS — G500 Trigeminal neuralgia: Secondary | ICD-10-CM | POA: Diagnosis not present

## 2022-06-27 DIAGNOSIS — I2510 Atherosclerotic heart disease of native coronary artery without angina pectoris: Secondary | ICD-10-CM

## 2022-06-27 DIAGNOSIS — G35 Multiple sclerosis: Secondary | ICD-10-CM

## 2022-06-27 DIAGNOSIS — M79671 Pain in right foot: Secondary | ICD-10-CM | POA: Diagnosis not present

## 2022-06-27 DIAGNOSIS — I878 Other specified disorders of veins: Secondary | ICD-10-CM | POA: Diagnosis not present

## 2022-06-27 MED ORDER — CARBAMAZEPINE 200 MG PO TABS
200.0000 mg | ORAL_TABLET | Freq: Two times a day (BID) | ORAL | 0 refills | Status: DC
Start: 2022-06-27 — End: 2022-07-21

## 2022-06-27 NOTE — Patient Instructions (Signed)
Xray today.  We will be in touch regarding the referral to [place].  Be sure to elevated your legs.  Take care  Dr. Lacinda Axon

## 2022-06-29 DIAGNOSIS — I878 Other specified disorders of veins: Secondary | ICD-10-CM | POA: Insufficient documentation

## 2022-06-29 NOTE — Assessment & Plan Note (Signed)
Patients pain and swelling likely from venous stasis. There is no evidence of infection at this time. Does not tolerate compression stockings. Advised rest and elevation. Patient was concerned about fracture of her foot. Xray was obtained and was independently reviewed by me. Interpretation: No evidence of fracture.

## 2022-06-29 NOTE — Assessment & Plan Note (Addendum)
MRI ordered. Neurology will be seeing her this coming week.

## 2022-06-29 NOTE — Progress Notes (Signed)
Subjective:  Patient ID: [name], female    DOB: [birth year]  Age: 71 y.o. MRN: [id number]  CC: Chief Complaint  Patient presents with   Leg Pain    Right leg also swelling.    HPI:  71 year old female presents for evaluation of the above.  Reports swelling and redness of the right foot. Patient reports an area of pain on the bottom of her right foot. She is concerned about fracture. No recent fall, trauma, injury.   Awaiting neurology and MRI appt.   Pain from Trigeminal neuralgia is controlled on Tegretol. She is now on Warfarin. Cardiology would prefer Eliquis or Xarelto for anticoagulation but this is limited by the Tegretol. They would like her seen by neurology sooner.  Patient Active Problem List   Diagnosis Date Noted   Venous stasis 06/29/2022   Trigeminal neuralgia 06/23/2022   Atrial fibrillation ([place]) 06/23/2022   Multiple sclerosis ([place]) 06/20/2022   Chronic pain syndrome 06/20/2022   CAD (coronary artery disease) 06/20/2022   History of lung cancer 06/20/2022   Aortic atherosclerosis ([place]) 06/20/2022   Anxiety 06/28/2016   Osteoporosis 08/07/2015   GERD (gastroesophageal reflux disease) 08/14/2011    Social Hx   Social History   Socioeconomic History   Marital status: Married    Spouse name: Not on file   Number of children: 1   Years of education: Not on file   Highest education level: Not on file  Occupational History   Occupation: self-employed Therapist, occupational: SELF-EMPLOYED  Tobacco Use   Smoking status: Former    Packs/day: 0.50    Types: Cigarettes   Smokeless tobacco: Never   Tobacco comments:    quit yrs ago  Vaping Use   Vaping Use: Never used  Substance and Sexual Activity   Alcohol use: No   Drug use: No   Sexual activity: Not on file  Other Topics Concern   Not on file  Social History Narrative   Not on file   Social Determinants of Health   Financial Resource Strain: Not on file  Food Insecurity: Not  on file  Transportation Needs: Not on file  Physical Activity: Not on file  Stress: Not on file  Social Connections: Not on file    Review of Systems Per HPI  Objective:  BP 121/85   Pulse 96   Temp (!) 97.4 F (36.3 C) (Oral)   Wt 227 lb (103 kg)   SpO2 95%   BMI 36.64 kg/m      06/27/2022   11:35 AM 06/25/2022    2:42 PM 06/20/2022    3:53 PM  BP/Weight  Systolic BP 867 672 094  Diastolic BP 85 80 83  Wt. (Lbs) 227 226.8 222  BMI 36.64 kg/m2 36.61 kg/m2 35.83 kg/m2    Physical Exam Vitals and nursing note reviewed.  Constitutional:      General: She is not in acute distress.    Appearance: She is obese.  HENT:     Head: Normocephalic and atraumatic.  Cardiovascular:     Comments: Irregularly irregular.  Pulmonary:     Effort: Pulmonary effort is normal. No respiratory distress.  Neurological:     Mental Status: She is alert.    Lab Results  Component Value Date   WBC 13.0 (H) 06/19/2022   HGB 13.6 06/19/2022   HCT 41.2 06/19/2022   PLT 201 06/19/2022   GLUCOSE 126 (H) 06/19/2022   CHOL 228 (H)  06/17/2016   TRIG 232 (H) 06/17/2016   HDL 47 06/17/2016   LDLCALC 135 (H) 06/17/2016   ALT 19 06/18/2022   AST 20 06/18/2022   NA 141 06/19/2022   K 4.2 06/19/2022   CL 107 06/19/2022   CREATININE 0.78 06/19/2022   BUN 29 (H) 06/19/2022   CO2 22 06/19/2022   TSH 2.900 06/17/2016   INR 0.9 11/04/2007     Assessment & Plan:   Problem List Items Addressed This Visit       Nervous and Auditory   Multiple sclerosis ([place])    MRI ordered. Neurology will be seeing her this coming week.      Relevant Orders   Ambulatory referral to Neurology   Trigeminal neuralgia - Primary    Advised to continue Tegretol for now. Defer continuation or discontinuation to neurology.      Relevant Orders   Ambulatory referral to Neurology     Other   Venous stasis    Patients pain and swelling likely from venous stasis. There is no evidence of infection at this  time. Does not tolerate compression stockings. Advised rest and elevation. Patient was concerned about fracture of her foot. Xray was obtained and was independently reviewed by me. Interpretation: No evidence of fracture.       Other Visit Diagnoses     Right foot pain       Relevant Orders   DG Foot Complete Right (Completed)      Thersa Salt DO [place]

## 2022-06-29 NOTE — Assessment & Plan Note (Signed)
Advised to continue Tegretol for now. Defer continuation or discontinuation to neurology.

## 2022-06-30 ENCOUNTER — Ambulatory Visit (INDEPENDENT_AMBULATORY_CARE_PROVIDER_SITE_OTHER): Payer: Medicare Other | Admitting: *Deleted

## 2022-06-30 DIAGNOSIS — I480 Paroxysmal atrial fibrillation: Secondary | ICD-10-CM

## 2022-06-30 DIAGNOSIS — Z5181 Encounter for therapeutic drug level monitoring: Secondary | ICD-10-CM

## 2022-06-30 DIAGNOSIS — I4891 Unspecified atrial fibrillation: Secondary | ICD-10-CM
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
## 2022-06-30 NOTE — Telephone Encounter (Signed)
Patient has appt with [facility] 07/04/22

## 2022-07-01 ENCOUNTER — Encounter: Payer: Self-pay | Admitting: Neurology

## 2022-07-01 ENCOUNTER — Ambulatory Visit (INDEPENDENT_AMBULATORY_CARE_PROVIDER_SITE_OTHER): Payer: Medicare Other | Admitting: Neurology

## 2022-07-01 VITALS — BP 116/75 | HR 73 | Ht 66.0 in | Wt 232.8 lb

## 2022-07-01 DIAGNOSIS — I2510 Atherosclerotic heart disease of native coronary artery without angina pectoris: Secondary | ICD-10-CM | POA: Diagnosis not present

## 2022-07-01 DIAGNOSIS — G35 Multiple sclerosis: Secondary | ICD-10-CM | POA: Diagnosis not present

## 2022-07-01 DIAGNOSIS — G500 Trigeminal neuralgia: Secondary | ICD-10-CM | POA: Diagnosis not present

## 2022-07-01 DIAGNOSIS — Z5181 Encounter for therapeutic drug level monitoring: Secondary | ICD-10-CM | POA: Insufficient documentation

## 2022-07-01 DIAGNOSIS — R519 Headache, unspecified: Secondary | ICD-10-CM | POA: Diagnosis not present

## 2022-07-01 LAB — POCT INR: INR: 1.3 — AB (ref 2.0–3.0)

## 2022-07-01 NOTE — Patient Instructions (Signed)
Started warfarin 5mg  daily on 06/26/22 Take warfarin 1 1/2 tablets today then increase dose to 1 tablet daily except 1 1/2 tablets on Tuesdays and Saturdays Recheck in 1 wk

## 2022-07-01 NOTE — Patient Instructions (Addendum)
Would continue carbamazepine 200mg  twice daily and pregablin 150mg  twice daily for now. In addition to MRI of brain, will add MRI of left trigeminal neuralgia Follow up 3 months.  Check CBC and CMP just prior to follow up.

## 2022-07-02 ENCOUNTER — Telehealth: Payer: Self-pay

## 2022-07-02 ENCOUNTER — Encounter: Payer: Self-pay | Admitting: Neurology

## 2022-07-02 DIAGNOSIS — G500 Trigeminal neuralgia: Secondary | ICD-10-CM

## 2022-07-02 DIAGNOSIS — R6884 Jaw pain: Secondary | ICD-10-CM

## 2022-07-02 DIAGNOSIS — G35 Multiple sclerosis: Secondary | ICD-10-CM

## 2022-07-02 NOTE — Telephone Encounter (Signed)
MRI Trigeminal and Brain With and Without Contrast no PA required.   Order faxed to [facility].

## 2022-07-02 NOTE — Addendum Note (Signed)
Addended by: Venetia Night on: 07/02/2022 10:46 AM   Modules accepted: Orders

## 2022-07-03 ENCOUNTER — Telehealth: Payer: Self-pay | Admitting: Neurology

## 2022-07-03 NOTE — Telephone Encounter (Signed)
Patient called to speak to [facility].

## 2022-07-03 NOTE — Telephone Encounter (Signed)
Pt's daughter called in returning [facility] call

## 2022-07-03 NOTE — Telephone Encounter (Signed)
Pts daughter is returning sheenas call. Said she will be on stand by. There has been some confusion and sheena has been very helpful.

## 2022-07-03 NOTE — Telephone Encounter (Signed)
Tried returning call. No answer. LMOVM to call the office back.

## 2022-07-07 ENCOUNTER — Ambulatory Visit (INDEPENDENT_AMBULATORY_CARE_PROVIDER_SITE_OTHER): Payer: Medicare Other | Admitting: *Deleted

## 2022-07-07 ENCOUNTER — Ambulatory Visit: Payer: Medicare Other | Admitting: Nurse Practitioner

## 2022-07-07 DIAGNOSIS — I480 Paroxysmal atrial fibrillation: Secondary | ICD-10-CM | POA: Diagnosis not present

## 2022-07-07 DIAGNOSIS — Z5181 Encounter for therapeutic drug level monitoring: Secondary | ICD-10-CM

## 2022-07-07 LAB — POCT INR: INR: 2 (ref 2.0–3.0)

## 2022-07-07 NOTE — Patient Instructions (Signed)
No DOAC at this time.  On tegratol and lyrica Started warfarin 5mg  daily on 06/26/22 Increase dose to 1 tablet daily except 1 1/2 tablets on Mondays, Wednesdays and Fridays Recheck in 1 wk

## 2022-07-08 ENCOUNTER — Other Ambulatory Visit: Payer: Self-pay | Admitting: Neurology

## 2022-07-08 ENCOUNTER — Other Ambulatory Visit: Payer: Medicare Other

## 2022-07-08 MED ORDER — PREGABALIN 75 MG PO CAPS: ORAL_CAPSULE | ORAL | 0 refills | Status: DC

## 2022-07-08 NOTE — Telephone Encounter (Signed)
Per Dr. Georgie Chard note, continue Lyrica 150mg  BID. Will send Rx

## 2022-07-15 ENCOUNTER — Telehealth: Payer: Self-pay | Admitting: Neurology

## 2022-07-15 ENCOUNTER — Other Ambulatory Visit: Payer: Self-pay | Admitting: Neurology

## 2022-07-15 ENCOUNTER — Ambulatory Visit (INDEPENDENT_AMBULATORY_CARE_PROVIDER_SITE_OTHER): Payer: Medicare Other | Admitting: *Deleted

## 2022-07-15 DIAGNOSIS — I480 Paroxysmal atrial fibrillation: Secondary | ICD-10-CM | POA: Diagnosis not present

## 2022-07-15 DIAGNOSIS — Z5181 Encounter for therapeutic drug level monitoring: Secondary | ICD-10-CM

## 2022-07-15 LAB — POCT INR: INR: 1.7 — AB (ref 2.0–3.0)

## 2022-07-15 MED ORDER — WARFARIN SODIUM 5 MG PO TABS: ORAL_TABLET | ORAL | 3 refills | Status: DC

## 2022-07-15 MED ORDER — DIAZEPAM 5 MG PO TABS: ORAL_TABLET | ORAL | 0 refills | Status: DC

## 2022-07-15 NOTE — Telephone Encounter (Signed)
Fax received from [place], Patient request for something to be called into the pharmacy to help her doing her MRI

## 2022-07-15 NOTE — Patient Instructions (Signed)
No DOAC at this time.  On tegratol and lyrica Started warfarin 5mg  daily on 06/26/22 Increase warfarin to 1 1/2 tablets daily except 1 tablet on Sundays and Thursdays Recheck in 10 days

## 2022-07-15 NOTE — Telephone Encounter (Signed)
Cyndia Diver from [facility] stopped by the office to report this patient was unable to complete her test this morning due to claustrophobia.  She wanted Dr. Tomi Likens to be aware.

## 2022-07-16 NOTE — Addendum Note (Signed)
Addended byTomi Likens, Emerald Shor R on: 07/16/2022 09:35 AM   Modules accepted: Orders

## 2022-07-21 ENCOUNTER — Encounter: Payer: Self-pay | Admitting: Family Medicine

## 2022-07-21 ENCOUNTER — Ambulatory Visit (INDEPENDENT_AMBULATORY_CARE_PROVIDER_SITE_OTHER): Payer: Medicare Other | Admitting: Family Medicine

## 2022-07-21 VITALS — BP 158/69 | HR 65 | Temp 98.1°F | Wt 230.0 lb

## 2022-07-21 DIAGNOSIS — I2510 Atherosclerotic heart disease of native coronary artery without angina pectoris: Secondary | ICD-10-CM

## 2022-07-21 DIAGNOSIS — M810 Age-related osteoporosis without current pathological fracture: Secondary | ICD-10-CM

## 2022-07-21 DIAGNOSIS — Z1211 Encounter for screening for malignant neoplasm of colon: Secondary | ICD-10-CM

## 2022-07-21 DIAGNOSIS — G500 Trigeminal neuralgia: Secondary | ICD-10-CM | POA: Diagnosis not present

## 2022-07-21 DIAGNOSIS — E782 Mixed hyperlipidemia: Secondary | ICD-10-CM

## 2022-07-21 DIAGNOSIS — I878 Other specified disorders of veins: Secondary | ICD-10-CM | POA: Diagnosis not present

## 2022-07-21 DIAGNOSIS — Z0000 Encounter for general adult medical examination without abnormal findings: Secondary | ICD-10-CM

## 2022-07-21 DIAGNOSIS — R739 Hyperglycemia, unspecified: Secondary | ICD-10-CM

## 2022-07-21 MED ORDER — CARBAMAZEPINE 200 MG PO TABS: 200.0000 mg | ORAL_TABLET | Freq: Two times a day (BID) | ORAL | 2 refills | Status: DC

## 2022-07-21 MED ORDER — PREGABALIN 150 MG PO CAPS
150.0000 mg | ORAL_CAPSULE | Freq: Two times a day (BID) | ORAL | 2 refills | Status: DC
Start: 2022-07-21 — End: 2023-02-05

## 2022-07-21 NOTE — Patient Instructions (Addendum)
Call [phone] to schedule mammogram.  I am refilling the medications.  Follow up in 3 months.  Take care  Dr. Lacinda Axon

## 2022-07-22 DIAGNOSIS — Z0000 Encounter for general adult medical examination without abnormal findings: Secondary | ICD-10-CM | POA: Insufficient documentation

## 2022-07-22 DIAGNOSIS — G35 Multiple sclerosis: Secondary | ICD-10-CM | POA: Diagnosis not present

## 2022-07-22 DIAGNOSIS — G500 Trigeminal neuralgia: Secondary | ICD-10-CM | POA: Diagnosis not present

## 2022-07-22 NOTE — Assessment & Plan Note (Signed)
Patient's edema is secondary to venous stasis.  Advised compression and elevation.

## 2022-07-22 NOTE — Progress Notes (Signed)
Subjective:  Patient ID: [name], female    DOB: 25-Oct-1951  Age: 70 y.o. MRN: [id number]  CC: Chief Complaint  Patient presents with   Follow-up    Pt reports painful abdomen all the time along with swelling/bloating. Pt has seen neuro. Pt was given Valium for MRI and is not sure how to take that with Hydrocodone. Pt states with MRI they are using a helmet and placing a rag over eyes. Pt states she is eating a lot of yogurt.     HPI:  71 year old female with trigeminal neuralgia, multiple sclerosis, osteoporosis, chronic pain syndrome, history of lung cancer, coronary artery disease, atrial fibrillation presents for follow-up.  Patient's blood pressure elevated today.  Elevated on repeat as well.  We will need to monitor closely.  Patient was unable to get MRI recently due to claustrophobia.  She would like to discuss the timing of her Valium today.  She is very concerned about going through with the MRI.  She is now seeing neurology.  Her trigeminal neuralgia is stable currently on Lyrica and carbamazepine.  Needs refills.  Warfarin is being managed by cardiology.  Patient continues to have swelling and pain of her lower extremities.    Patient is in need of several preventative healthcare items.  Okay with proceeding with colonoscopy.  Needs additional labs.  Needs mammogram and bone density as well.  Advised shingles vaccine and pneumococcal vaccine.  Patient Active Problem List   Diagnosis Date Noted   Preventative health care 07/22/2022   Encounter for therapeutic drug monitoring 07/01/2022   Venous stasis 06/29/2022   Trigeminal neuralgia 06/23/2022   Atrial fibrillation ([place]) 06/23/2022   Multiple sclerosis ([place]) 06/20/2022   Chronic pain syndrome 06/20/2022   CAD (coronary artery disease) 06/20/2022   History of lung cancer 06/20/2022   Aortic atherosclerosis ([place]) 06/20/2022   Anxiety 06/28/2016   Osteoporosis 08/07/2015   GERD (gastroesophageal reflux  disease) 08/14/2011    Social Hx   Social History   Socioeconomic History   Marital status: Married    Spouse name: Not on file   Number of children: 1   Years of education: Not on file   Highest education level: Not on file  Occupational History   Occupation: self-employed Therapist, occupational: SELF-EMPLOYED  Tobacco Use   Smoking status: Former    Packs/day: 0.50    Types: Cigarettes   Smokeless tobacco: Never   Tobacco comments:    quit yrs ago  Vaping Use   Vaping Use: Never used  Substance and Sexual Activity   Alcohol use: No   Drug use: No   Sexual activity: Not on file  Other Topics Concern   Not on file  Social History Narrative   Left handed   Social Determinants of Health   Financial Resource Strain: Not on file  Food Insecurity: Not on file  Transportation Needs: Not on file  Physical Activity: Not on file  Stress: Not on file  Social Connections: Not on file   Review of Systems Per HPI  Objective:  BP (!) 158/69   Pulse 65   Temp 98.1 F (36.7 C)   Wt 230 lb (104.3 kg)   SpO2 98%   BMI 37.12 kg/m      07/21/2022    5:16 PM 07/21/2022    4:13 PM 07/01/2022    2:55 PM  BP/Weight  Systolic BP 937 902 409  Diastolic BP 69 66 75  Wt. (Lbs)  230 232.8  BMI  37.12 kg/m2 37.57 kg/m2    Physical Exam Vitals and nursing note reviewed.  Constitutional:      General: She is not in acute distress.    Appearance: She is obese.  HENT:     Head: Normocephalic and atraumatic.  Eyes:     General:        Right eye: No discharge.        Left eye: No discharge.     Conjunctiva/sclera: Conjunctivae normal.  Cardiovascular:     Rate and Rhythm: Normal rate and regular rhythm.  Pulmonary:     Effort: Pulmonary effort is normal.     Breath sounds: Normal breath sounds. No wheezing or rales.  Neurological:     Mental Status: She is alert.     Lab Results  Component Value Date   WBC 13.0 (H) 06/19/2022   HGB 13.6 06/19/2022   HCT  41.2 06/19/2022   PLT 201 06/19/2022   GLUCOSE 126 (H) 06/19/2022   CHOL 228 (H) 06/17/2016   TRIG 232 (H) 06/17/2016   HDL 47 06/17/2016   LDLCALC 135 (H) 06/17/2016   ALT 19 06/18/2022   AST 20 06/18/2022   NA 141 06/19/2022   K 4.2 06/19/2022   CL 107 06/19/2022   CREATININE 0.78 06/19/2022   BUN 29 (H) 06/19/2022   CO2 22 06/19/2022   TSH 2.900 06/17/2016   INR 1.7 (A) 07/15/2022     Assessment & Plan:   Problem List Items Addressed This Visit       Nervous and Auditory   Trigeminal neuralgia - Primary    Stable currently.  Managed by neurology.  Has upcoming MRI.  Continue carbamazepine and Lyrica.  Refilled today.      Relevant Medications   pregabalin (LYRICA) 150 MG capsule   carbamazepine (TEGRETOL) 200 MG tablet     Musculoskeletal and Integument   Osteoporosis    DEXA scan ordered.      Relevant Orders   DG Bone Density     Other   Preventative health care    Referral to GI placed for colonoscopy. Number given to schedule mammogram. Labs ordered. DEXA scan ordered. Discussed pneumococcal and shingles vaccination.  She will consider.       Venous stasis    Patient's edema is secondary to venous stasis.  Advised compression and elevation.      Other Visit Diagnoses     Encounter for screening colonoscopy       Relevant Orders   Ambulatory referral to Gastroenterology   Blood glucose elevated       Relevant Orders   Hemoglobin A1c   Mixed hyperlipidemia       Relevant Orders   Lipid Panel       Meds ordered this encounter  Medications   pregabalin (LYRICA) 150 MG capsule    Sig: Take 1 capsule (150 mg total) by mouth 2 (two) times daily.    Dispense:  180 capsule    Refill:  2   carbamazepine (TEGRETOL) 200 MG tablet    Sig: Take 1 tablet (200 mg total) by mouth in the morning and at bedtime.    Dispense:  180 tablet    Refill:  2    Follow-up:  3 months.  [place]

## 2022-07-22 NOTE — Telephone Encounter (Signed)
Patient advised.  I sent a script for diazepam to her pharmacy.  She cannot drive if taken

## 2022-07-22 NOTE — Assessment & Plan Note (Signed)
-   DEXA scan ordered

## 2022-07-22 NOTE — Assessment & Plan Note (Signed)
Referral to GI placed for colonoscopy. Number given to schedule mammogram. Labs ordered. DEXA scan ordered. Discussed pneumococcal and shingles vaccination.  She will consider.

## 2022-07-22 NOTE — Assessment & Plan Note (Signed)
Stable currently.  Managed by neurology.  Has upcoming MRI.  Continue carbamazepine and Lyrica.  Refilled today.

## 2022-07-23 ENCOUNTER — Encounter: Payer: Self-pay | Admitting: *Deleted

## 2022-07-23 ENCOUNTER — Ambulatory Visit (INDEPENDENT_AMBULATORY_CARE_PROVIDER_SITE_OTHER): Payer: Medicare Other | Admitting: *Deleted

## 2022-07-23 DIAGNOSIS — Z5181 Encounter for therapeutic drug level monitoring: Secondary | ICD-10-CM | POA: Diagnosis not present

## 2022-07-23 DIAGNOSIS — I480 Paroxysmal atrial fibrillation: Secondary | ICD-10-CM

## 2022-07-23 LAB — POCT INR: INR: 1.8 — AB (ref 2.0–3.0)

## 2022-07-23 NOTE — Patient Instructions (Signed)
No DOAC at this time.  On tegratol and lyrica Started warfarin 5mg  daily on 06/26/22 Take warfarin 2 tablets tonight then increase dose to 1 1/2 tablets daily Recheck on 07/31/22

## 2022-07-24 ENCOUNTER — Telehealth: Payer: Self-pay | Admitting: Neurology

## 2022-07-24 NOTE — Telephone Encounter (Signed)
Pt's daughter called in stating she saw the MRI results in mychart and would like a call to discuss those results.

## 2022-07-25 ENCOUNTER — Telehealth: Payer: Self-pay | Admitting: *Deleted

## 2022-07-25 NOTE — Telephone Encounter (Signed)
Daughter called and wanted to schedule visit to review recent MRI ordered thru neurology. Office visit scheduled 07/29/22 at 10:40 am

## 2022-07-25 NOTE — Telephone Encounter (Signed)
Results, MRI shows evidence of age related changes and old MS lesions.  There is also a probable meningioma (which is a benign tumor over the brain and usually of no clinical significance) as well as a cyst (which would be benign) or artifact.  Also evidence of possible sinusitis (if she is experiencing symptoms of sinusitis such as congestion and runny nose, may want to see PCP).  However imaging reveals no alternative cause for trigeminal neuralgia other than the MS.  Just to be cautious, would repeat MRI of brain with and without contrast in 3 months just to make sure that the meningioma is not something else.  Will have to be done at the same place she had this MRI because she is claustrophobic.  When it is closer to the MRI date, contact us for diazepam.   Daughter advised.  Daughter wanted to know if her mother sinus infections (Chronic) could be connected to Meningoma.

## 2022-07-25 NOTE — Telephone Encounter (Signed)
Patient daughter advised of Dr.Jaffe note, No they are unrelated.

## 2022-07-29 ENCOUNTER — Ambulatory Visit: Payer: Medicare Other | Admitting: Family Medicine

## 2022-07-31 ENCOUNTER — Ambulatory Visit: Payer: Medicare Other | Attending: Cardiovascular Disease | Admitting: *Deleted

## 2022-07-31 DIAGNOSIS — I480 Paroxysmal atrial fibrillation: Secondary | ICD-10-CM | POA: Insufficient documentation

## 2022-07-31 DIAGNOSIS — Z5181 Encounter for therapeutic drug level monitoring: Secondary | ICD-10-CM | POA: Insufficient documentation

## 2022-07-31 LAB — POCT INR: INR: 2.3 (ref 2.0–3.0)

## 2022-07-31 NOTE — Patient Instructions (Signed)
No DOAC at this time.  On tegratol and lyrica Started warfarin 5mg  daily on 06/26/22 Continue warfarin 1 1/2 tablets daily Recheck in 2 wks

## 2022-08-05 ENCOUNTER — Telehealth: Payer: Self-pay | Admitting: *Deleted

## 2022-08-05 ENCOUNTER — Ambulatory Visit (INDEPENDENT_AMBULATORY_CARE_PROVIDER_SITE_OTHER): Payer: Medicare Other | Admitting: Family Medicine

## 2022-08-05 DIAGNOSIS — G500 Trigeminal neuralgia: Secondary | ICD-10-CM

## 2022-08-05 DIAGNOSIS — G35 Multiple sclerosis: Secondary | ICD-10-CM

## 2022-08-05 NOTE — Progress Notes (Signed)
Virtual Visit via Telephone Note  I connected with [name] on 08/05/22 at  1:10 PM EDT by telephone and verified that I am speaking with the correct person using two identifiers.  Location: Patient: Home (patient's daughter present as well)>  Provider: Home   I discussed the limitations, risks, security and privacy concerns of performing an evaluation and management service by telephone and the availability of in person appointments. I also discussed with the patient that there may be a patient responsible charge related to this service. The patient expressed understanding and agreed to proceed.   History of Present Illness:  71 year old female with atrial fibrillation, coronary artery disease per CT, MS, trigeminal neuralgia, osteoporosis, chronic pain syndrome, history of lung cancer, presents today to discuss MRI findings.  Patient is accompanied by her daughter.  Recent MRI brain and facial MRI reviewed.  Patient and daughter concerned about the fact that there have been additional areas noted on MRI as compared to previous MRIs.  There was no active demyelination.  I reassured them that this is a good thing.  Daughter and patient are very concerned about suspected 6 mm meningioma as well as possible CSF flow abnormality versus cyst.  They are also concerned about the opacification of the right sphenoid sinus.  Daughter states that she has done some research and is concerned that there is a correlation between the issue in the sphenoid sinus and the meningioma.  They would like my opinion regarding the findings on the MRI.  Overall she seems to be stable and doing well at this time.  She is followed by cardiology as well as neurology.  Observations/Objective: Patient in good spirits.  No appreciable distress noted via phone call.  Speaking in full sentences.   Assessment and Plan: MS & Trigeminal Neuralgia -stable.  Patient is to continue carbamazepine and Lyrica.  Advised the  patient and her daughter that the findings that were noted regarding possible meningioma as well as cyst and sphenoid sinus opacification are likely benign and not significant.  They are still quite concerned.  They will consider referral to neurosurgery and get back with me if they want to proceed with referral.  I did advise him that they should have a repeat MRI as recommended per radiology and patient's neurologist.  Follow Up Instructions:    I discussed the assessment and treatment plan with the patient. The patient was provided an opportunity to ask questions and all were answered. The patient agreed with the plan and demonstrated an understanding of the instructions.   The patient was advised to call back or seek an in-person evaluation if the symptoms worsen or if the condition fails to improve as anticipated.  I provided 20 minutes of non-face-to-face time during this encounter.   Coral Spikes, DO

## 2022-08-05 NOTE — Telephone Encounter (Signed)
[name], wolfer are scheduled for a virtual visit with your provider today.    Just as we do with appointments in the office, we must obtain your consent to participate.  Your consent will be active for this visit and any virtual visit you may have with one of our providers in the next 365 days.    If you have a MyChart account, I can also send a copy of this consent to you electronically.  All virtual visits are billed to your insurance company just like a traditional visit in the office.  As this is a virtual visit, video technology does not allow for your provider to perform a traditional examination.  This may limit your provider's ability to fully assess your condition.  If your provider identifies any concerns that need to be evaluated in person or the need to arrange testing such as labs, EKG, etc, we will make arrangements to do so.    Although advances in technology are sophisticated, we cannot ensure that it will always work on either your end or our end.  If the connection with a video visit is poor, we may have to switch to a telephone visit.  With either a video or telephone visit, we are not always able to ensure that we have a secure connection.   I need to obtain your verbal consent now.   Are you willing to proceed with your visit today?   [name] has provided verbal consent on 08/05/2022 for a virtual visit (video or telephone).

## 2022-08-11 DIAGNOSIS — Z79891 Long term (current) use of opiate analgesic: Secondary | ICD-10-CM | POA: Diagnosis not present

## 2022-08-11 DIAGNOSIS — M47816 Spondylosis without myelopathy or radiculopathy, lumbar region: Secondary | ICD-10-CM | POA: Diagnosis not present

## 2022-08-11 DIAGNOSIS — G894 Chronic pain syndrome: Secondary | ICD-10-CM | POA: Diagnosis not present

## 2022-08-11 DIAGNOSIS — M4316 Spondylolisthesis, lumbar region: Secondary | ICD-10-CM | POA: Diagnosis not present

## 2022-08-13 ENCOUNTER — Ambulatory Visit: Payer: Medicare Other | Attending: Cardiovascular Disease | Admitting: *Deleted

## 2022-08-13 DIAGNOSIS — I480 Paroxysmal atrial fibrillation: Secondary | ICD-10-CM | POA: Diagnosis not present

## 2022-08-13 DIAGNOSIS — Z5181 Encounter for therapeutic drug level monitoring: Secondary | ICD-10-CM | POA: Insufficient documentation

## 2022-08-13 LAB — POCT INR: INR: 1.9 — AB (ref 2.0–3.0)

## 2022-08-13 NOTE — Patient Instructions (Signed)
No DOAC at this time.  On tegratol and lyrica Started warfarin 5mg  daily on 06/26/22 Increase warfarin 1 1/2 tablets daily except 2 tablets on Wednesdays and Saturdays Recheck in 2 wks

## 2022-08-29 ENCOUNTER — Ambulatory Visit: Payer: Medicare Other | Attending: Cardiovascular Disease | Admitting: *Deleted

## 2022-08-29 ENCOUNTER — Other Ambulatory Visit (HOSPITAL_COMMUNITY): Payer: Medicare Other

## 2022-08-29 DIAGNOSIS — Z5181 Encounter for therapeutic drug level monitoring: Secondary | ICD-10-CM

## 2022-08-29 DIAGNOSIS — I480 Paroxysmal atrial fibrillation: Secondary | ICD-10-CM | POA: Diagnosis not present

## 2022-08-29 LAB — POCT INR: INR: 3.4 — AB (ref 2.0–3.0)

## 2022-08-29 MED ORDER — WARFARIN SODIUM 5 MG PO TABS: ORAL_TABLET | ORAL | 3 refills | Status: DC

## 2022-08-29 NOTE — Patient Instructions (Signed)
No DOAC at this time.  On tegratol and lyrica Started warfarin 5mg  daily on 06/26/22 Take warfarin 1/2 tablet today then decrease dose to 1 1/2 tablets daily  Recheck in 2 wks

## 2022-09-01 ENCOUNTER — Ambulatory Visit (HOSPITAL_COMMUNITY)
Admission: RE | Admit: 2022-09-01 | Discharge: 2022-09-01 | Disposition: A | Discharge status: Home / Self Care | Payer: Medicare Other | Source: Ambulatory Visit | Attending: Family Medicine | Admitting: Family Medicine

## 2022-09-01 DIAGNOSIS — M810 Age-related osteoporosis without current pathological fracture: Secondary | ICD-10-CM | POA: Diagnosis not present

## 2022-09-09 ENCOUNTER — Ambulatory Visit (INDEPENDENT_AMBULATORY_CARE_PROVIDER_SITE_OTHER): Payer: Medicare Other

## 2022-09-09 VITALS — Ht 66.0 in | Wt 230.0 lb

## 2022-09-09 DIAGNOSIS — Z0000 Encounter for general adult medical examination without abnormal findings: Secondary | ICD-10-CM

## 2022-09-09 NOTE — Patient Instructions (Signed)
[name] , Thank you for taking time to come for your Medicare Wellness Visit. I appreciate your ongoing commitment to your health goals. Please review the following plan we discussed and let me know if I can assist you in the future.   Screening recommendations/referrals: Colonoscopy: declined referral  Mammogram: declined referral Bone Density: 09/01/22 Recommended yearly ophthalmology/optometry visit for glaucoma screening and checkup Recommended yearly dental visit for hygiene and checkup  Vaccinations: Influenza vaccine: n/d Pneumococcal vaccine: n/d Tdap vaccine: n/d Shingles vaccine: n/d   Covid-19:n/d  Advanced directives: yes, copy requested  Conditions/risks identified: no  Next appointment: Follow up in one year for your annual wellness visit 09/22/23 @ 8:15 am by phone   Preventive Care 65 Years and Older, Female Preventive care refers to lifestyle choices and visits with your health care provider that can promote health and wellness. What does preventive care include? A yearly physical exam. This is also called an annual well check. Dental exams once or twice a year. Routine eye exams. Ask your health care provider how often you should have your eyes checked. Personal lifestyle choices, including: Daily care of your teeth and gums. Regular physical activity. Eating a healthy diet. Avoiding tobacco and drug use. Limiting alcohol use. Practicing safe sex. Taking low-dose aspirin every day. Taking vitamin and mineral supplements as recommended by your health care provider. What happens during an annual well check? The services and screenings done by your health care provider during your annual well check will depend on your age, overall health, lifestyle risk factors, and family history of disease. Counseling  Your health care provider may ask you questions about your: Alcohol use. Tobacco use. Drug use. Emotional well-being. Home and relationship  well-being. Sexual activity. Eating habits. History of falls. Memory and ability to understand (cognition). Work and work Statistician. Reproductive health. Screening  You may have the following tests or measurements: Height, weight, and BMI. Blood pressure. Lipid and cholesterol levels. These may be checked every 5 years, or more frequently if you are over 45 years old. Skin check. Lung cancer screening. You may have this screening every year starting at age 69 if you have a 30-pack-year history of smoking and currently smoke or have quit within the past 15 years. Fecal occult blood test (FOBT) of the stool. You may have this test every year starting at age 33. Flexible sigmoidoscopy or colonoscopy. You may have a sigmoidoscopy every 5 years or a colonoscopy every 10 years starting at age 34. Hepatitis C blood test. Hepatitis B blood test. Sexually transmitted disease (STD) testing. Diabetes screening. This is done by checking your blood sugar (glucose) after you have not eaten for a while (fasting). You may have this done every 1-3 years. Bone density scan. This is done to screen for osteoporosis. You may have this done starting at age 57. Mammogram. This may be done every 1-2 years. Talk to your health care provider about how often you should have regular mammograms. Talk with your health care provider about your test results, treatment options, and if necessary, the need for more tests. Vaccines  Your health care provider may recommend certain vaccines, such as: Influenza vaccine. This is recommended every year. Tetanus, diphtheria, and acellular pertussis (Tdap, Td) vaccine. You may need a Td booster every 10 years. Zoster vaccine. You may need this after age 29. Pneumococcal 13-valent conjugate (PCV13) vaccine. One dose is recommended after age 66. Pneumococcal polysaccharide (PPSV23) vaccine. One dose is recommended after age 35. Talk to  your health care provider about which  screenings and vaccines you need and how often you need them. This information is not intended to replace advice given to you by your health care provider. Make sure you discuss any questions you have with your health care provider. Document Released: 12/14/2015 Document Revised: 08/06/2016 Document Reviewed: 09/18/2015 Elsevier Interactive Patient Education  2017 [place] Prevention in the Home Falls can cause injuries. They can happen to people of all ages. There are many things you can do to make your home safe and to help prevent falls. What can I do on the outside of my home? Regularly fix the edges of walkways and driveways and fix any cracks. Remove anything that might make you trip as you walk through a door, such as a raised step or threshold. Trim any bushes or trees on the path to your home. Use bright outdoor lighting. Clear any walking paths of anything that might make someone trip, such as rocks or tools. Regularly check to see if handrails are loose or broken. Make sure that both sides of any steps have handrails. Any raised decks and porches should have guardrails on the edges. Have any leaves, snow, or ice cleared regularly. Use sand or salt on walking paths during winter. Clean up any spills in your garage right away. This includes oil or grease spills. What can I do in the bathroom? Use night lights. Install grab bars by the toilet and in the tub and shower. Do not use towel bars as grab bars. Use non-skid mats or decals in the tub or shower. If you need to sit down in the shower, use a plastic, non-slip stool. Keep the floor dry. Clean up any water that spills on the floor as soon as it happens. Remove soap buildup in the tub or shower regularly. Attach bath mats securely with double-sided non-slip rug tape. Do not have throw rugs and other things on the floor that can make you trip. What can I do in the bedroom? Use night lights. Make sure that you have a  light by your bed that is easy to reach. Do not use any sheets or blankets that are too big for your bed. They should not hang down onto the floor. Have a firm chair that has side arms. You can use this for support while you get dressed. Do not have throw rugs and other things on the floor that can make you trip. What can I do in the kitchen? Clean up any spills right away. Avoid walking on wet floors. Keep items that you use a lot in easy-to-reach places. If you need to reach something above you, use a strong step stool that has a grab bar. Keep electrical cords out of the way. Do not use floor polish or wax that makes floors slippery. If you must use wax, use non-skid floor wax. Do not have throw rugs and other things on the floor that can make you trip. What can I do with my stairs? Do not leave any items on the stairs. Make sure that there are handrails on both sides of the stairs and use them. Fix handrails that are broken or loose. Make sure that handrails are as long as the stairways. Check any carpeting to make sure that it is firmly attached to the stairs. Fix any carpet that is loose or worn. Avoid having throw rugs at the top or bottom of the stairs. If you do have throw rugs, attach  them to the floor with carpet tape. Make sure that you have a light switch at the top of the stairs and the bottom of the stairs. If you do not have them, ask someone to add them for you. What else can I do to help prevent falls? Wear shoes that: Do not have high heels. Have rubber bottoms. Are comfortable and fit you well. Are closed at the toe. Do not wear sandals. If you use a stepladder: Make sure that it is fully opened. Do not climb a closed stepladder. Make sure that both sides of the stepladder are locked into place. Ask someone to hold it for you, if possible. Clearly mark and make sure that you can see: Any grab bars or handrails. First and last steps. Where the edge of each step  is. Use tools that help you move around (mobility aids) if they are needed. These include: Canes. Walkers. Scooters. Crutches. Turn on the lights when you go into a dark area. Replace any light bulbs as soon as they burn out. Set up your furniture so you have a clear path. Avoid moving your furniture around. If any of your floors are uneven, fix them. If there are any pets around you, be aware of where they are. Review your medicines with your doctor. Some medicines can make you feel dizzy. This can increase your chance of falling. Ask your doctor what other things that you can do to help prevent falls. This information is not intended to replace advice given to you by your health care provider. Make sure you discuss any questions you have with your health care provider. Document Released: 09/13/2009 Document Revised: 04/24/2016 Document Reviewed: 12/22/2014 Elsevier Interactive Patient Education  2017 [employer].
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
## 2022-09-15 ENCOUNTER — Ambulatory Visit: Payer: Medicare Other | Attending: Cardiovascular Disease | Admitting: *Deleted

## 2022-09-15 DIAGNOSIS — Z5181 Encounter for therapeutic drug level monitoring: Secondary | ICD-10-CM

## 2022-09-15 DIAGNOSIS — I480 Paroxysmal atrial fibrillation: Secondary | ICD-10-CM

## 2022-09-15 LAB — POCT INR: INR: 1.8 — AB (ref 2.0–3.0)

## 2022-09-15 NOTE — Patient Instructions (Signed)
No DOAC at this time.  On tegratol and lyrica Started warfarin 5mg  daily on 06/26/22 Increase warfarin to 1 1/2 tablets daily except 2 tablets on Mondays and Thursdays Recheck in 3 wks

## 2022-09-19 ENCOUNTER — Ambulatory Visit: Payer: Medicare Other | Admitting: Cardiovascular Disease

## 2022-10-06 ENCOUNTER — Ambulatory Visit: Payer: Medicare Other | Attending: Cardiovascular Disease | Admitting: *Deleted

## 2022-10-06 DIAGNOSIS — Z5181 Encounter for therapeutic drug level monitoring: Secondary | ICD-10-CM | POA: Diagnosis not present

## 2022-10-06 DIAGNOSIS — I480 Paroxysmal atrial fibrillation: Secondary | ICD-10-CM | POA: Diagnosis not present

## 2022-10-06 LAB — POCT INR: INR: 1.8 — AB (ref 2.0–3.0)

## 2022-10-06 NOTE — Patient Instructions (Signed)
No DOAC at this time.  On tegratol and lyrica Started warfarin 5mg  daily on 06/26/22 Increase warfarin to 2 tablets daily except 1 1/2  tablets on Sundays, Tuesdays and Thursdays Recheck in 3 wks

## 2022-10-09 ENCOUNTER — Ambulatory Visit: Payer: Medicare Other | Admitting: Neurology

## 2022-10-27 ENCOUNTER — Ambulatory Visit: Payer: Medicare Other | Attending: Cardiovascular Disease | Admitting: *Deleted

## 2022-10-27 DIAGNOSIS — Z5181 Encounter for therapeutic drug level monitoring: Secondary | ICD-10-CM | POA: Diagnosis not present

## 2022-10-27 DIAGNOSIS — I480 Paroxysmal atrial fibrillation: Secondary | ICD-10-CM

## 2022-10-27 LAB — POCT INR: INR: 3.4 — AB (ref 2.0–3.0)

## 2022-10-27 NOTE — Patient Instructions (Signed)
No DOAC at this time.  On tegratol and lyrica Started warfarin 5mg  daily on 06/26/22 Take warfarin 1 tablet tonight then decrease dose to 1 1/2 tablets daily except 2 tablets on Mondays, Wednesdays and Fridays Recheck in 3 wks

## 2022-11-04 ENCOUNTER — Ambulatory Visit (INDEPENDENT_AMBULATORY_CARE_PROVIDER_SITE_OTHER): Payer: Medicare Other | Admitting: Family Medicine

## 2022-11-04 ENCOUNTER — Encounter: Payer: Self-pay | Admitting: Family Medicine

## 2022-11-04 VITALS — BP 156/82 | HR 58

## 2022-11-04 DIAGNOSIS — D329 Benign neoplasm of meninges, unspecified: Secondary | ICD-10-CM

## 2022-11-04 DIAGNOSIS — R918 Other nonspecific abnormal finding of lung field: Secondary | ICD-10-CM

## 2022-11-04 DIAGNOSIS — K219 Gastro-esophageal reflux disease without esophagitis: Secondary | ICD-10-CM | POA: Diagnosis not present

## 2022-11-04 DIAGNOSIS — G500 Trigeminal neuralgia: Secondary | ICD-10-CM

## 2022-11-04 DIAGNOSIS — G35 Multiple sclerosis: Secondary | ICD-10-CM | POA: Diagnosis not present

## 2022-11-04 DIAGNOSIS — I2510 Atherosclerotic heart disease of native coronary artery without angina pectoris: Secondary | ICD-10-CM

## 2022-11-04 DIAGNOSIS — F419 Anxiety disorder, unspecified: Secondary | ICD-10-CM

## 2022-11-04 DIAGNOSIS — Z85118 Personal history of other malignant neoplasm of bronchus and lung: Secondary | ICD-10-CM | POA: Diagnosis not present

## 2022-11-04 MED ORDER — PANTOPRAZOLE SODIUM 40 MG PO TBEC: 40.0000 mg | DELAYED_RELEASE_TABLET | Freq: Every day | ORAL | 3 refills | Status: DC

## 2022-11-04 MED ORDER — DIAZEPAM 5 MG PO TABS: ORAL_TABLET | ORAL | 0 refills | Status: DC

## 2022-11-04 NOTE — Assessment & Plan Note (Signed)
I recommended against use of Valium on a regular basis.  Benzodiazepines are not a good option for her given her age and use of other medications.  We discussed SNRI, SSRI, BuSpar, Atarax.  She will think about these options.

## 2022-11-04 NOTE — Assessment & Plan Note (Signed)
Starting on Protonix.  If continues to have symptoms we will need referral to GI for endoscopy.

## 2022-11-04 NOTE — Patient Instructions (Addendum)
Medication as prescribed.  Consider medication to help with mood as we discussed.  We will set up MRI.  We will set up follow up CT scan of the chest.  Follow up in 3 months.

## 2022-11-04 NOTE — Assessment & Plan Note (Signed)
Advised to take Lyrica and carbamazepine twice daily as prescribed.  Follow-up with neurology.

## 2022-11-04 NOTE — Assessment & Plan Note (Signed)
Previous MRI revealed suspected meningioma.  32-month follow-up was recommended.  MRI order has been placed.  Valium prior to MRI due to claustrophobia.

## 2022-11-04 NOTE — Progress Notes (Signed)
Subjective:  Patient ID: [name], female    DOB: 08-16-1951  Age: 71 y.o. MRN: [id number]  CC: Chief Complaint  Patient presents with   DIscussion    Pt has not had MRI completed yet. MRI Brain and MRI face has not been done. Wanting to know if MRI can be done at [facility] or at [place] on [address].     HPI:  71 year old female with an extensive PMH including coronary artery disease, aortic atherosclerosis, atrial fibrillation, GERD, MS, trigeminal neuralgia, osteoporosis, chronic pain syndrome, fibromyalgia, anxiety presents for evaluation of the above.  Patient is due to have a repeat MRI to ensure stability of suspected meningioma.  She would like to discuss getting this scheduled today.  She sees neurology.  She has an upcoming appointment.  Hope is for this to be done before she sees her neurologist.  Patient continues to have ongoing issues with trigeminal neuralgia but this is fairly well-controlled with carbamazepine and Lyrica.  Patient is not taking her medication as prescribed.  She does not take Lyrica twice a day.  She takes carbamazepine twice a day.  I advised compliance with both medications twice a day.  Additionally, patient reporting ongoing anxiety and irritability.  PHQ-9 score of 4.  GAD-7 score of 15.  Patient inquiring about use of Valium on a regular basis.  I advised against this.  We discussed other medication options.  Patient also inquiring about repeat CT scan given her history of lung cancer and pulmonary nodules.  Last scan was in 2022.  Lastly, patient having ongoing issues with swallowing.  She reports she has difficulty eating.  Suspect underlying GERD is contributing significantly.  She has a very limited diet.  She states that she has difficulty swallowing.  She states that she uses Tums regularly.  Patient Active Problem List   Diagnosis Date Noted   Meningioma ([place]) 11/04/2022   Pulmonary nodules 11/04/2022   Preventative health care  07/22/2022   Venous stasis 06/29/2022   Trigeminal neuralgia 06/23/2022   Atrial fibrillation ([place]) 06/23/2022   Multiple sclerosis ([place]) 06/20/2022   Chronic pain syndrome 06/20/2022   CAD (coronary artery disease) 06/20/2022   History of lung cancer 06/20/2022   Aortic atherosclerosis ([place]) 06/20/2022   Fibromyalgia affecting multiple sites 11/18/2017   Anxiety 06/28/2016   Osteoporosis 08/07/2015   GERD (gastroesophageal reflux disease) 08/14/2011   Hypercholesterolemia 03/09/2009    Social Hx   Social History   Socioeconomic History   Marital status: Married    Spouse name: Not on file   Number of children: 1   Years of education: Not on file   Highest education level: Not on file  Occupational History   Occupation: self-employed Therapist, occupational: SELF-EMPLOYED  Tobacco Use   Smoking status: Former    Packs/day: 0.50    Types: Cigarettes   Smokeless tobacco: Never   Tobacco comments:    quit yrs ago  Vaping Use   Vaping Use: Never used  Substance and Sexual Activity   Alcohol use: No   Drug use: No   Sexual activity: Not on file  Other Topics Concern   Not on file  Social History Narrative   Left handed   Social Determinants of Health   Financial Resource Strain: Low Risk  (09/09/2022)   Overall Financial Resource Strain (CARDIA)    Difficulty of Paying Living Expenses: Not hard at all  Food Insecurity: No Food Insecurity (09/09/2022)  Hunger Vital Sign    Worried About Running Out of Food in the Last Year: Never true    Ran Out of Food in the Last Year: Never true  Transportation Needs: No Transportation Needs (09/09/2022)   PRAPARE - Hydrologist (Medical): No    Lack of Transportation (Non-Medical): No  Physical Activity: Inactive (09/09/2022)   Exercise Vital Sign    Days of Exercise per Week: 0 days    Minutes of Exercise per Session: 0 min  Stress: No Stress Concern Present (09/09/2022)   [place]    Feeling of Stress : Not at all  Social Connections: Socially Isolated (09/09/2022)   Social Connection and Isolation Panel [NHANES]    Frequency of Communication with Friends and Family: Once a week    Frequency of Social Gatherings with Friends and Family: Never    Attends Religious Services: Never    Marine scientist or Organizations: No    Attends Archivist Meetings: Never    Marital Status: Married    Review of Systems Per HPI  Objective:  BP (!) 156/82   Pulse (!) 58   SpO2 96%      11/04/2022    2:03 PM 11/04/2022    1:14 PM 09/09/2022    2:45 PM  BP/Weight  Systolic BP 224 825   Diastolic BP 82 83   Wt. (Lbs)   230  BMI   37.12 kg/m2    Physical Exam Vitals and nursing note reviewed.  Constitutional:      General: She is not in acute distress.    Appearance: She is obese.  HENT:     Head: Normocephalic and atraumatic.  Eyes:     General:        Right eye: No discharge.        Left eye: No discharge.     Conjunctiva/sclera: Conjunctivae normal.  Pulmonary:     Effort: Pulmonary effort is normal. No respiratory distress.  Neurological:     Mental Status: She is alert.  Psychiatric:     Comments: Anxious.     Lab Results  Component Value Date   WBC 13.0 (H) 06/19/2022   HGB 13.6 06/19/2022   HCT 41.2 06/19/2022   PLT 201 06/19/2022   GLUCOSE 126 (H) 06/19/2022   CHOL 228 (H) 06/17/2016   TRIG 232 (H) 06/17/2016   HDL 47 06/17/2016   LDLCALC 135 (H) 06/17/2016   ALT 19 06/18/2022   AST 20 06/18/2022   NA 141 06/19/2022   K 4.2 06/19/2022   CL 107 06/19/2022   CREATININE 0.78 06/19/2022   BUN 29 (H) 06/19/2022   CO2 22 06/19/2022   TSH 2.900 06/17/2016   INR 3.4 (A) 10/27/2022     Assessment & Plan:   Problem List Items Addressed This Visit       Respiratory   Pulmonary nodules   Relevant Orders   CT Chest Wo Contrast     Digestive   GERD  (gastroesophageal reflux disease)    Starting on Protonix.  If continues to have symptoms we will need referral to GI for endoscopy.      Relevant Medications   pantoprazole (PROTONIX) 40 MG tablet     Nervous and Auditory   Meningioma (HCC) - Primary    Previous MRI revealed suspected meningioma.  76-month follow-up was recommended.  MRI order has been placed.  Valium prior  to MRI due to claustrophobia.      Relevant Orders   MR Brain W Wo Contrast   Multiple sclerosis (HCC)   Relevant Orders   MR Brain W Wo Contrast   Trigeminal neuralgia    Advised to take Lyrica and carbamazepine twice daily as prescribed.  Follow-up with neurology.      Relevant Medications   diazepam (VALIUM) 5 MG tablet     Other   Anxiety    I recommended against use of Valium on a regular basis.  Benzodiazepines are not a good option for her given her age and use of other medications.  We discussed SNRI, SSRI, BuSpar, Atarax.  She will think about these options.      Relevant Medications   diazepam (VALIUM) 5 MG tablet   History of lung cancer    Repeat CT scan ordered.      Relevant Orders   CT Chest Wo Contrast    Meds ordered this encounter  Medications   pantoprazole (PROTONIX) 40 MG tablet    Sig: Take 1 tablet (40 mg total) by mouth daily.    Dispense:  30 tablet    Refill:  3   diazepam (VALIUM) 5 MG tablet    Sig: Take 1 tablet 40 minutes prior to MRI. May repeat at MRI facility    Dispense:  2 tablet    Refill:  0    Follow-up:  Return in about 3 months (around 02/03/2023).  Juana Diaz

## 2022-11-04 NOTE — Assessment & Plan Note (Signed)
Repeat CT scan ordered.

## 2022-11-05 ENCOUNTER — Ambulatory Visit (HOSPITAL_COMMUNITY): Payer: Medicare Other | Attending: Family Medicine

## 2022-11-10 DIAGNOSIS — M797 Fibromyalgia: Secondary | ICD-10-CM | POA: Diagnosis not present

## 2022-11-10 DIAGNOSIS — G894 Chronic pain syndrome: Secondary | ICD-10-CM | POA: Diagnosis not present

## 2022-11-10 DIAGNOSIS — M47816 Spondylosis without myelopathy or radiculopathy, lumbar region: Secondary | ICD-10-CM | POA: Diagnosis not present

## 2022-11-10 DIAGNOSIS — M2550 Pain in unspecified joint: Secondary | ICD-10-CM | POA: Diagnosis not present

## 2022-11-10 DIAGNOSIS — M4316 Spondylolisthesis, lumbar region: Secondary | ICD-10-CM | POA: Diagnosis not present
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
## 2022-11-12 ENCOUNTER — Ambulatory Visit: Payer: Medicare Other | Admitting: Neurology

## 2022-11-17 ENCOUNTER — Ambulatory Visit: Payer: Medicare Other | Attending: Cardiovascular Disease | Admitting: *Deleted

## 2022-11-17 DIAGNOSIS — I480 Paroxysmal atrial fibrillation: Secondary | ICD-10-CM

## 2022-11-17 DIAGNOSIS — Z5181 Encounter for therapeutic drug level monitoring: Secondary | ICD-10-CM

## 2022-11-17 LAB — POCT INR: INR: 2.6 (ref 2.0–3.0)

## 2022-11-17 NOTE — Patient Instructions (Signed)
No DOAC at this time.  On tegratol and lyrica Continue warfarin 1 1/2 tablets daily except 2 tablets on Mondays, Wednesdays and Fridays Recheck in 4 wks

## 2022-11-26 ENCOUNTER — Ambulatory Visit: Payer: Medicare Other | Admitting: Neurology

## 2022-12-03 ENCOUNTER — Ambulatory Visit (HOSPITAL_COMMUNITY): Payer: Medicare Other

## 2022-12-09 ENCOUNTER — Ambulatory Visit: Payer: Medicare Other | Admitting: Neurology
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
## 2022-12-30 ENCOUNTER — Ambulatory Visit: Payer: Medicare Other | Admitting: Neurology

## 2023-01-20 ENCOUNTER — Other Ambulatory Visit: Payer: Self-pay | Admitting: Cardiovascular Disease

## 2023-01-20 NOTE — Telephone Encounter (Signed)
Prescription refill request received for warfarin Lov: [name] 06/25/2022 Next INR check: 1/15 Warfarin tablet strength:  5mg 

## 2023-02-03 ENCOUNTER — Ambulatory Visit: Payer: Medicare Other | Admitting: Family Medicine

## 2023-02-03 ENCOUNTER — Other Ambulatory Visit: Payer: Self-pay | Admitting: Family Medicine

## 2023-02-04 DIAGNOSIS — M47816 Spondylosis without myelopathy or radiculopathy, lumbar region: Secondary | ICD-10-CM | POA: Diagnosis not present

## 2023-02-04 DIAGNOSIS — G894 Chronic pain syndrome: Secondary | ICD-10-CM | POA: Diagnosis not present

## 2023-02-04 DIAGNOSIS — M4316 Spondylolisthesis, lumbar region: Secondary | ICD-10-CM | POA: Diagnosis not present

## 2023-02-04 DIAGNOSIS — Z79891 Long term (current) use of opiate analgesic: Secondary | ICD-10-CM | POA: Diagnosis not present

## 2023-02-04 DIAGNOSIS — M797 Fibromyalgia: Secondary | ICD-10-CM | POA: Diagnosis not present

## 2023-02-04 DIAGNOSIS — M2550 Pain in unspecified joint: Secondary | ICD-10-CM | POA: Diagnosis not present

## 2023-03-13 ENCOUNTER — Other Ambulatory Visit: Payer: Self-pay | Admitting: Family Medicine

## 2023-03-16 ENCOUNTER — Ambulatory Visit: Payer: Medicare Other | Attending: Internal Medicine | Admitting: *Deleted

## 2023-03-16 DIAGNOSIS — I480 Paroxysmal atrial fibrillation: Secondary | ICD-10-CM | POA: Diagnosis not present

## 2023-03-16 DIAGNOSIS — Z5181 Encounter for therapeutic drug level monitoring: Secondary | ICD-10-CM

## 2023-03-16 LAB — POCT INR: INR: 2.3 (ref 2.0–3.0)

## 2023-03-16 NOTE — Patient Instructions (Signed)
No DOAC at this time.  On tegratol and lyrica Doesn't like cutting pills in half. Start taking 2 tablets daily except 1 tablet on Tuesdays and Saturdays (Same as current dose) Recheck in 4 wks

## 2023-04-01 ENCOUNTER — Telehealth: Payer: Self-pay | Admitting: Family Medicine

## 2023-04-01 NOTE — Telephone Encounter (Signed)
[name] scheduled their annual wellness visit. Appointment made for 09/08/2023.  Thank you,  Judeth Cornfield,  AMB Clinical Support [facility] AWV Program Direct Dial ??[phone]

## 2023-04-02 ENCOUNTER — Other Ambulatory Visit: Payer: Self-pay | Admitting: Cardiovascular Disease

## 2023-04-02 NOTE — Telephone Encounter (Signed)
Prescription refill request received for warfarin Lov: 06/25/2022  Next INR check: 5/13 Warfarin tablet strength: 5mg 

## 2023-04-09 ENCOUNTER — Telehealth: Payer: Self-pay | Admitting: Neurology

## 2023-04-09 DIAGNOSIS — G35 Multiple sclerosis: Secondary | ICD-10-CM

## 2023-04-09 DIAGNOSIS — G500 Trigeminal neuralgia: Secondary | ICD-10-CM

## 2023-04-09 DIAGNOSIS — D329 Benign neoplasm of meninges, unspecified: Secondary | ICD-10-CM

## 2023-04-09 NOTE — Telephone Encounter (Signed)
Left message with the after hour service on 04-09-23 at 12:06 pm  Caller states that in order to receive the MRI she would need an order sent to [facility]

## 2023-04-10 NOTE — Telephone Encounter (Signed)
Per  chart note 07/2022 patient to repeat MRI brain W/Wo contrast in three months. Per patient had a lot going on then. But she ready now.  Please send order to [facility] for Oprn MRI

## 2023-04-10 NOTE — Telephone Encounter (Signed)
Pt called in wanting to see if the referral for [facility] was sent in?

## 2023-04-13 ENCOUNTER — Ambulatory Visit: Payer: Medicare Other | Attending: Cardiovascular Disease | Admitting: *Deleted

## 2023-04-13 DIAGNOSIS — I480 Paroxysmal atrial fibrillation: Secondary | ICD-10-CM | POA: Insufficient documentation

## 2023-04-13 DIAGNOSIS — Z5181 Encounter for therapeutic drug level monitoring: Secondary | ICD-10-CM | POA: Diagnosis not present

## 2023-04-13 LAB — POCT INR: INR: 2.5 (ref 2.0–3.0)

## 2023-04-13 NOTE — Patient Instructions (Signed)
No DOAC at this time.  On tegratol and lyrica Doesn't like cutting pills in half. Continue 2 tablets daily except 1 tablet on Tuesdays and Saturdays (Same as current dose) Recheck in 6 wks

## 2023-04-30 DIAGNOSIS — D320 Benign neoplasm of cerebral meninges: Secondary | ICD-10-CM | POA: Diagnosis not present

## 2023-05-05 ENCOUNTER — Telehealth: Payer: Self-pay
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
## 2023-05-05 NOTE — Telephone Encounter (Signed)
-----   Message from Drema Dallas, DO sent at 05/04/2023  2:54 PM EDT ----- Overall, MRI of brain is unrevealing.  It shows a few small spots but nothing current or concerning.

## 2023-05-05 NOTE — Telephone Encounter (Signed)
Telephone call to patient no answer. LMOVM for patient to call the office back.

## 2023-05-05 NOTE — Telephone Encounter (Signed)
Pt called in returning [facility] call. She also stated she has an appointment tomorrow. Worst case scenario if she misses the next call she can just find out tomorrow.

## 2023-05-06 ENCOUNTER — Ambulatory Visit (INDEPENDENT_AMBULATORY_CARE_PROVIDER_SITE_OTHER): Payer: Medicare Other | Admitting: Neurology

## 2023-05-06 ENCOUNTER — Other Ambulatory Visit (INDEPENDENT_AMBULATORY_CARE_PROVIDER_SITE_OTHER): Payer: Medicare Other

## 2023-05-06 ENCOUNTER — Encounter: Payer: Self-pay | Admitting: Neurology

## 2023-05-06 VITALS — BP 102/60 | HR 82

## 2023-05-06 DIAGNOSIS — G35 Multiple sclerosis: Secondary | ICD-10-CM

## 2023-05-06 DIAGNOSIS — G35D Multiple sclerosis, unspecified: Secondary | ICD-10-CM

## 2023-05-06 DIAGNOSIS — G500 Trigeminal neuralgia: Secondary | ICD-10-CM | POA: Diagnosis not present

## 2023-05-06 LAB — CBC
HCT: 38.8 % (ref 36.0–46.0)
Hemoglobin: 12.6 g/dL (ref 12.0–15.0)
MCHC: 32.5 g/dL (ref 30.0–36.0)
MCV: 89.5 fl (ref 78.0–100.0)
Platelets: 162 10*3/uL (ref 150.0–400.0)
RBC: 4.33 Mil/uL (ref 3.87–5.11)
RDW: 13.8 % (ref 11.5–15.5)
WBC: 6.6 10*3/uL (ref 4.0–10.5)

## 2023-05-06 NOTE — Patient Instructions (Signed)
Continue carbamazepine 200mg  twice daily and pregablin 150mg  twice daily. Check CBC and CMP Bring a copy of the last brain MRI on a disc to me Follow up 6 months.

## 2023-05-07 LAB — COMPREHENSIVE METABOLIC PANEL
ALT: 11 U/L (ref 0–35)
AST: 15 U/L (ref 0–37)
Albumin: 4.2 g/dL (ref 3.5–5.2)
Alkaline Phosphatase: 47 U/L (ref 39–117)
BUN: 22 mg/dL (ref 6–23)
CO2: 26 mEq/L (ref 19–32)
Calcium: 9.3 mg/dL (ref 8.4–10.5)
Chloride: 104 mEq/L (ref 96–112)
Creatinine, Ser: 0.77 mg/dL (ref 0.40–1.20)
GFR: 76.81 mL/min (ref >60.00)
Glucose, Bld: 87 mg/dL (ref 70–99)
Potassium: 4.4 mEq/L (ref 3.5–5.1)
Sodium: 142 mEq/L (ref 135–145)
Total Bilirubin: 0.3 mg/dL (ref 0.2–1.2)
Total Protein: 7.3 g/dL (ref 6.0–8.3)

## 2023-05-11 DIAGNOSIS — M47816 Spondylosis without myelopathy or radiculopathy, lumbar region: Secondary | ICD-10-CM | POA: Diagnosis not present

## 2023-05-11 DIAGNOSIS — M2550 Pain in unspecified joint: Secondary | ICD-10-CM | POA: Diagnosis not present

## 2023-05-11 DIAGNOSIS — G894 Chronic pain syndrome: Secondary | ICD-10-CM | POA: Diagnosis not present

## 2023-05-11 DIAGNOSIS — M4316 Spondylolisthesis, lumbar region: Secondary | ICD-10-CM | POA: Diagnosis not present

## 2023-05-11 DIAGNOSIS — Z79891 Long term (current) use of opiate analgesic: Secondary | ICD-10-CM | POA: Diagnosis not present

## 2023-05-11 DIAGNOSIS — M797 Fibromyalgia: Secondary | ICD-10-CM | POA: Diagnosis not present

## 2023-05-14 ENCOUNTER — Other Ambulatory Visit: Payer: Self-pay | Admitting: Cardiovascular Disease

## 2023-06-02 ENCOUNTER — Ambulatory Visit: Payer: Medicare Other | Attending: Cardiovascular Disease | Admitting: *Deleted

## 2023-06-02 DIAGNOSIS — I480 Paroxysmal atrial fibrillation: Secondary | ICD-10-CM | POA: Diagnosis not present

## 2023-06-02 DIAGNOSIS — Z5181 Encounter for therapeutic drug level monitoring: Secondary | ICD-10-CM | POA: Diagnosis not present

## 2023-06-02 LAB — POCT INR: INR: 2.4 (ref 2.0–3.0)

## 2023-06-02 NOTE — Patient Instructions (Signed)
No DOAC at this time.  On tegratol and lyrica Continue 2 tablets daily except 1 tablet on Tuesdays and Saturdays  Recheck in 6 wks

## 2023-06-22 ENCOUNTER — Telehealth: Payer: Self-pay | Admitting: Cardiovascular Disease

## 2023-06-22 NOTE — Telephone Encounter (Signed)
Call sent straight to triage. Patient is complaining of her low BP 95/60, that is now back up again. Patient stated her BP is always going up and down. Patient also is worried about her HR that has been between 80 and 100. Informed patient that this is normal. Patient stated she is feeling fine right now. Informed patient that a message would be sent to [facility] office where she sees Dr. Eden Emms to see if they can get her in. Patient is due for an office visit. Encouraged patient to stay hydrated and drink more fluids when her BP drops. Informed patient if she starts to have symptoms with her low BP and it does not come up with drinking fluids to seek medical attention.

## 2023-06-22 NOTE — Telephone Encounter (Signed)
STAT if HR is under 50 or over 120 (normal HR is 60-100 beats per minute)  What is your heart rate? 80's a little while ago, 63 now  Do you have a log of your heart rate readings (document readings)? 80-100, up and down  Do you have any other symptoms? Her BP has also been going up and down  Pt c/o BP issue:  1. What are your last 5 BP readings? 112/73, 95/60 while on the phone now 2. Are you having any other symptom dizziness, headache, blurred vision, passed out)? no 3. What is your medication issue? States her BP and HR have been up and down   Patient states her HR and BP has been up and down. She says it comes and goes and she had been feeling pretty good. She says she is not sure if her machine is working correctly and will also put in some new batteries. She says she was given propanol for her palpations at her last appointment but they did not help.

## 2023-06-23 ENCOUNTER — Encounter: Payer: Self-pay | Admitting: Nurse Practitioner

## 2023-06-23 ENCOUNTER — Ambulatory Visit: Payer: Medicare Other

## 2023-06-23 ENCOUNTER — Other Ambulatory Visit: Payer: Self-pay | Admitting: Nurse Practitioner

## 2023-06-23 ENCOUNTER — Ambulatory Visit: Payer: Medicare Other | Attending: Nurse Practitioner | Admitting: Nurse Practitioner

## 2023-06-23 ENCOUNTER — Telehealth: Payer: Self-pay | Admitting: Nurse Practitioner

## 2023-06-23 VITALS — BP 142/98 | HR 84 | Ht 66.0 in | Wt 239.6 lb

## 2023-06-23 DIAGNOSIS — I2584 Coronary atherosclerosis due to calcified coronary lesion: Secondary | ICD-10-CM | POA: Diagnosis not present

## 2023-06-23 DIAGNOSIS — I480 Paroxysmal atrial fibrillation: Secondary | ICD-10-CM

## 2023-06-23 DIAGNOSIS — R0789 Other chest pain: Secondary | ICD-10-CM | POA: Insufficient documentation

## 2023-06-23 DIAGNOSIS — R002 Palpitations: Secondary | ICD-10-CM | POA: Diagnosis not present

## 2023-06-23 DIAGNOSIS — R0989 Other specified symptoms and signs involving the circulatory and respiratory systems: Secondary | ICD-10-CM | POA: Diagnosis not present

## 2023-06-23 DIAGNOSIS — I2510 Atherosclerotic heart disease of native coronary artery without angina pectoris: Secondary | ICD-10-CM | POA: Diagnosis not present

## 2023-06-23 DIAGNOSIS — R0609 Other forms of dyspnea: Secondary | ICD-10-CM | POA: Insufficient documentation

## 2023-06-23 MED ORDER — BLOOD PRESSURE MONITOR DEVI: 1.0000 | Freq: Every day | 0 refills | Status: AC

## 2023-06-23 NOTE — Progress Notes (Unsigned)
Cardiology Office Note:  .   Date:  06/23/2023 ID:  [name], DOB 01-29-1951, MRN [id number] PCP: Tommie Sams, DO  [facility] Providers Cardiologist:  None    History of Present Illness: .   [name] is a 72 y.o. female with PMH of coronary artery calcification, atypical chest pain, PAF, palpitations, GERD, reflux, lung cancer, hiatal hernia, MS, trigeminal neuralgia, and self reported hx of meningioma who presents today for scheduled follow-up.   Last saw Dr. Eden Emms 1 year ago. Was mainly sedentary d/t MS.  Toprol XL increased to 50 mg daily for better rate control with A-fib.   Today she presents for follow-up. Recently contacted out office admitting to labile HR and BP readings. She states she has been monitoring her heart rate and fluctuates between 80-100 beats per minutes. Can feel a "jerky" sensation that she believes are palpitations, but states it is difficult to tell. Does admit to labile BP readings at home, uses a wrist cuff. SBP ranging from 70-120. Denies any chest pain, but admits to indigestion symptoms as well as atypical sensation of right sharp chest pain that she attributes to her MS symptoms. Does experiences some shortness of breath at times with exertion. Denies any syncope, presyncope, dizziness, orthopnea, PND, swelling or significant weight changes, acute bleeding, or claudication.  Studies Reviewed: Marland Kitchen    EKG:  EKG Interpretation Date/Time:  Tuesday June 23 2023 13:03:20 EDT Ventricular Rate:  99 PR Interval:    QRS Duration:  86 QT Interval:  326 QTC Calculation: 418 R Axis:   3  Text Interpretation: Atrial fibrillation When compared with ECG of 19-Jun-2022 23:02, PREVIOUS ECG IS PRESENT Confirmed by Sharlene Dory [phone]) on 06/23/2023 1:08:18 PM   Risk Assessment/Calculations:    CHA2DS2-VASc Score = 2  This indicates a 2.2% annual risk of stroke. The patient's score is based upon: CHF History: 0 HTN History: 0 Diabetes History:  0 Stroke History: 0 Vascular Disease History: 0 Age Score: 1 Gender Score: 1  Physical Exam:   VS:  BP (!) 140/78 (BP Location: Left Arm, Patient Position: Sitting, Cuff Size: Large)   Pulse 84   Ht 5\' 6"  (1.676 m)   Wt 239 lb 9.6 oz (108.7 kg)   SpO2 96%   BMI 38.67 kg/m    Wt Readings from Last 3 Encounters:  06/23/23 239 lb 9.6 oz (108.7 kg)  09/09/22 230 lb (104.3 kg)  07/21/22 230 lb (104.3 kg)    GEN: Obese, 72 y.o. female in no acute distress NECK: No JVD; No carotid bruits CARDIAC: S1/S2, irregularly irregular, no murmurs, rubs, gallops RESPIRATORY:  Clear to auscultation without rales, wheezing or rhonchi  ABDOMEN: Soft, non-tender, non-distended EXTREMITIES:  No edema; No deformity   ASSESSMENT AND PLAN: .    Coronary artery calcification, atypical chest pain Denies any anginal symptoms, admits to atypical chest pains symptoms attributed to her MS. Will continue to monitor at this time. No medication changes at this time.  Heart healthy diet and regular cardiovascular exercise encouraged. Will obtain FLP.   PAF, palpitations Admits to palpitations with labile BP readings, but denies any tachycardia. Will arrange 14 day Zio XT monitor to further evaluate her A-fib. Will also arrange the following labs: thyroid panel, CMET, and magnesium level. Continue current medication regimen. Continue to follow-up at Coumadin clinic.  3.  Labile blood pressure readings  Admits to SBP ranging from 70-120.  Does own a wrist cuff-I wonder if this is giving  some inaccurate readings.  Will send in a Rx for an accurate arm BP cuff.  No medication changes at this time. Discussed to monitor BP at home at least 2 hours after medications and sitting for 5-10 minutes. Heart healthy diet and regular cardiovascular exercise encouraged.   4. DOE Etiology multifactorial. Mainly sedentary and likely d/t MS and deconditioning. Arranging monitor and getting labs as mentioned above. Care and ED  precautions discussed. Denies any anginal symptoms or red flag symptoms. If not improved by next OV, consider Echo/and or ischemic evaluation if necessary.   Dispo: Follow-up with me or APP in 6 to 8 weeks or sooner if anything changes.  Signed, Sharlene Dory, NP

## 2023-06-23 NOTE — Patient Instructions (Addendum)
Medication Instructions:  Your physician recommends that you continue on your current medications as directed. Please refer to the Current Medication list given to you today.  Labwork: Lab Work in 3 weeks at [facility]   Testing/Procedures: ZIO- Long Term Monitor Instructions   Your physician has requested you wear your ZIO patch monitor 14 days.   This is a single patch monitor.  [employer] supplies one patch monitor per enrollment.  Additional stickers are not available.   Please do not apply patch if you will be having a Nuclear Stress Test, Echocardiogram, Cardiac CT, MRI, or Chest Xray during the time frame you would be wearing the monitor. The patch cannot be worn during these tests.  You cannot remove and re-apply the ZIO XT patch monitor.   Your ZIO patch monitor will be sent USPS Priority mail from [facility] directly to your home address. The monitor may also be mailed to a PO BOX if home delivery is not available.   It may take 3-5 days to receive your monitor after you have been enrolled.   Once you have received you monitor, please review enclosed instructions.  Your monitor has already been registered assigning a specific monitor serial # to you.   Applying the monitor   Shave hair from upper left chest.   Hold abrader disc by orange tab.  Rub abrader in 40 strokes over left upper chest as indicated in your monitor instructions.   Clean area with 4 enclosed alcohol pads .  Use all pads to assure are is cleaned thoroughly.  Let dry.   Apply patch as indicated in monitor instructions.  Patch will be place under collarbone on left side of chest with arrow pointing upward.   Rub patch adhesive wings for 2 minutes.Remove white label marked "1".  Remove white label marked "2".  Rub patch adhesive wings for 2 additional minutes.   While looking in a mirror, press and release button in center of patch.  A small green light will flash 3-4 times .  This will be your only  indicator the monitor has been turned on.     Do not shower for the first 24 hours.  You may shower after the first 24 hours.   Press button if you feel a symptom. You will hear a small click.  Record Date, Time and Symptom in the Patient Log Book.   When you are ready to remove patch, follow instructions on last 2 pages of Patient Log Book.  Stick patch monitor onto last page of Patient Log Book.   Place Patient Log Book in [place] box.  Use locking tab on box and tape box closed securely.  The [employer] and [employer] has [employer] on it.  Please place in mailbox as soon as possible.  Your physician should have your test results approximately 7 days after the monitor has been mailed back to [facility].   Call [facility] Customer Care at [phone] if you have questions regarding your ZIO XT patch monitor.  Call them immediately if you see an orange light blinking on your monitor.   If your monitor falls off in less than 4 days contact our Monitor department at [phone].  If your monitor becomes loose or falls off after 4 days call [employer] at [phone] for suggestions on securing your monitor.   Follow-Up: Your physician recommends that you schedule a follow-up appointment in: 6-8 weeks with Philis Nettle   Any Other Special Instructions Will Be Listed Below (  If Applicable).  If you need a refill on your cardiac medications before your next appointment, please call your pharmacy.

## 2023-06-23 NOTE — Telephone Encounter (Signed)
I spoke with patient and she is anxious and concerned her heart rate is fast at times and she is feeling palpitations. Her BP is also low at times and this concerns her.She really wants to be seen and reassured.Apt made to see E.Peck,NP today at 1 pm in the [place] office.

## 2023-06-23 NOTE — Telephone Encounter (Signed)
ZIO XT 14 days

## 2023-06-25 ENCOUNTER — Encounter: Payer: Self-pay | Admitting: Nurse Practitioner

## 2023-06-30 ENCOUNTER — Other Ambulatory Visit: Payer: Self-pay | Admitting: Cardiovascular Disease

## 2023-07-07 NOTE — Telephone Encounter (Signed)
error 

## 2023-07-08 ENCOUNTER — Other Ambulatory Visit: Payer: Self-pay | Admitting: Family Medicine

## 2023-07-09 DIAGNOSIS — I480 Paroxysmal atrial fibrillation: Secondary | ICD-10-CM | POA: Diagnosis not present

## 2023-07-23 ENCOUNTER — Ambulatory Visit: Payer: Medicare Other | Attending: Cardiovascular Disease | Admitting: *Deleted

## 2023-07-23 DIAGNOSIS — Z5181 Encounter for therapeutic drug level monitoring: Secondary | ICD-10-CM

## 2023-07-23 DIAGNOSIS — I480 Paroxysmal atrial fibrillation: Secondary | ICD-10-CM

## 2023-07-23 LAB — POCT INR: INR: 3.9 — AB (ref 2.0–3.0)

## 2023-07-23 NOTE — Patient Instructions (Signed)
No DOAC at this time.  On tegratol and lyrica Hold warfarin tonight then decrease dose to 2 tablets daily except 1 tablet on Tuesdays, Thursdays and Saturdays  Recheck in 3 wks

## 2023-07-26 ENCOUNTER — Other Ambulatory Visit: Payer: Self-pay | Admitting: Cardiovascular Disease

## 2023-08-05 ENCOUNTER — Other Ambulatory Visit (HOSPITAL_COMMUNITY)
Admission: RE | Admit: 2023-08-05 | Discharge: 2023-08-05 | Disposition: A | Discharge status: Home / Self Care | Payer: Medicare Other | Source: Ambulatory Visit | Attending: Otolaryngology | Admitting: Otolaryngology

## 2023-08-05 DIAGNOSIS — I2510 Atherosclerotic heart disease of native coronary artery without angina pectoris: Secondary | ICD-10-CM | POA: Insufficient documentation

## 2023-08-05 DIAGNOSIS — I2584 Coronary atherosclerosis due to calcified coronary lesion: Secondary | ICD-10-CM | POA: Diagnosis not present

## 2023-08-05 DIAGNOSIS — R002 Palpitations: Secondary | ICD-10-CM | POA: Insufficient documentation

## 2023-08-05 LAB — COMPREHENSIVE METABOLIC PANEL
ALT: 15 U/L (ref 0–44)
AST: 16 U/L (ref 15–41)
Albumin: 4 g/dL (ref 3.5–5.0)
Alkaline Phosphatase: 43 U/L (ref 38–126)
Anion gap: 10 (ref 5–15)
BUN: 26 mg/dL — ABNORMAL HIGH (ref 8–23)
CO2: 25 mmol/L (ref 22–32)
Calcium: 9.1 mg/dL (ref 8.9–10.3)
Chloride: 104 mmol/L (ref 98–111)
Creatinine, Ser: 0.67 mg/dL (ref 0.44–1.00)
GFR, Estimated: >60 mL/min (ref >60)
Glucose, Bld: 97 mg/dL (ref 70–99)
Potassium: 4.2 mmol/L (ref 3.5–5.1)
Sodium: 139 mmol/L (ref 135–145)
Total Bilirubin: 0.4 mg/dL (ref 0.3–1.2)
Total Protein: 6.9 g/dL (ref 6.5–8.1)

## 2023-08-05 LAB — LIPID PANEL
Cholesterol: 248 mg/dL — ABNORMAL HIGH (ref 0–200)
HDL: 56 mg/dL (ref >40)
LDL Cholesterol: 169 mg/dL — ABNORMAL HIGH (ref 0–99)
Total CHOL/HDL Ratio: 4.4 ratio
Triglycerides: 113 mg/dL (ref <150)
VLDL: 23 mg/dL (ref 0–40)

## 2023-08-05 LAB — MAGNESIUM: Magnesium: 1.9 mg/dL (ref 1.7–2.4)

## 2023-08-05 NOTE — Progress Notes (Signed)
Cardiology Office Note:  .   Date:  08/06/2023 ID:  [name], DOB 08/16/1951, MRN [id number] PCP: Tommie Sams, DO  [facility] Providers Cardiologist:  Charlton Haws, MD    History of Present Illness: .   [name] is a 72 y.o. female with PMH of coronary artery calcification, atypical chest pain, PAF, palpitations, GERD, reflux, lung cancer, s/p left thoracotomy, hiatal hernia, MS, trigeminal neuralgia, and self reported hx of meningioma who presents today for scheduled follow-up.   Last saw Dr. Eden Emms 1 year ago. Was mainly sedentary d/t MS.  Toprol XL increased to 50 mg daily for better rate control with A-fib.   I last saw her for follow-up on June 23, 2023. Admitted labile HR and BP readings and noticed palpitations/ "jerky" sensation. Admitted to labile BP readings at home, uses a wrist cuff. SBP ranging from 70-120. Denied any chest pain, but admits to indigestion symptoms as well as atypical sensation of right sharp chest pain that she attributed to her MS symptoms. Noted some shortness of breath at times with exertion. Denied any syncope, presyncope, dizziness, orthopnea, PND, swelling or significant weight changes, acute bleeding, or claudication. Monitor arranged and report noted below. EKG revealed rate controlled A-fib.   Today she presents for follow-up. Continues to note palpitations/sensation of "skipped beats" Tells me she feels as though "it doesn't seem to be straightening out." Denies any chest pain or shortness of breath. Does admit to some epigastric pain/reflux/GERD symptoms. Swelling has improved from last office visit per her report. Denies any syncope, presyncope, dizziness, orthopnea, PND, acute bleeding, or claudication. Up about 25 lbs since last year.   Studies Reviewed: Marland Kitchen    Monitor 06/2023: Patch Wear Time:  13 days and 23 hours (2024-07-23T13:56:45-0400 to 2024-08-06T13:56:37-0400)   Atrial Fibrillation occurred continuously (100% burden),  ranging from 60-187 bpm (avg of 94 bpm). Isolated VEs were rare (<1.0%), VE Couplets were rare (<1.0%), and no VE Triplets were present.    Charlton Haws MD [facility]  Risk Assessment/Calculations:    CHA2DS2-VASc Score = 2  This indicates a 2.2% annual risk of stroke. The patient's score is based upon: CHF History: 0 HTN History: 0 Diabetes History: 0 Stroke History: 0 Vascular Disease History: 0 Age Score: 1 Gender Score: 1  Physical Exam:   VS:  BP 120/70   Pulse 92   Ht 5\' 6"  (1.676 m)   Wt 242 lb (109.8 kg)   SpO2 95%   BMI 39.06 kg/m    Wt Readings from Last 3 Encounters:  08/06/23 242 lb (109.8 kg)  06/23/23 239 lb 9.6 oz (108.7 kg)  09/09/22 230 lb (104.3 kg)    GEN: Obese, 72 y.o. female in no acute distress NECK: No JVD; No carotid bruits CARDIAC: S1/S2, irregularly irregular, no murmurs, rubs, gallops RESPIRATORY:  Clear to auscultation without rales, wheezing or rhonchi  ABDOMEN: Soft, non-tender, non-distended EXTREMITIES:  No edema; No deformity   ASSESSMENT AND PLAN: .    Persistent A-fib, palpitations Continues to admit to sensation of palpitations. Recent monitor revealed continuous A-fib burden with HR ranging from 60-187 bpm. HR today 92 bpm. Thyroid panel and electrolytes WNL. Discussed avoiding triggers to A-fib. Also discussed treatment options for A-fib, including DCCV. Compliant with Coumadin and follow-up at [facility].  Patient states at this time, she politely requests medication management. Will increase Toprol XL to 75 mg daily.  Continue follow-up with Coumadin clinic as scheduled, denies any bleeding issues. Consider DCCV  in future if medication management does not improve her symptoms.   Coronary artery calcification, HLD Denies any anginal symptoms. No indication for ischemic evaluation. Not on ASA d/t being on Coumadin. Instructed her to start taking Crestor 10 mg daily d/t HLD and to let us know if she develops any myalgias. At next visit,  will discuss getting FLP/LFT. Heart healthy diet and regular cardiovascular exercise encouraged.   3. Epigastric pain, hiatal hernia, GERD Known hx of hiatal hernia and admits to some GI symptoms. Discussed/recommended to follow-up with PCP. Continue Protonix 40 mg daily. Continue to follow with PCP.  4. Obesity Weight loss via diet and exercise encouraged. Discussed the impact being overweight would have on cardiovascular risk.  Dispo: Follow-up with me or APP in 4-6 weeks or sooner if anything changes. Patient requests to follow-up via telephone visit.   Signed, Sharlene Dory, NP

## 2023-08-06 ENCOUNTER — Telehealth: Payer: Self-pay | Admitting: Nurse Practitioner

## 2023-08-06 ENCOUNTER — Encounter: Payer: Self-pay | Admitting: Nurse Practitioner

## 2023-08-06 ENCOUNTER — Ambulatory Visit: Payer: Medicare Other | Attending: Nurse Practitioner | Admitting: Nurse Practitioner

## 2023-08-06 VITALS — BP 120/70 | HR 92 | Ht 66.0 in | Wt 242.0 lb

## 2023-08-06 DIAGNOSIS — R1013 Epigastric pain: Secondary | ICD-10-CM | POA: Insufficient documentation

## 2023-08-06 DIAGNOSIS — R002 Palpitations: Secondary | ICD-10-CM | POA: Insufficient documentation

## 2023-08-06 DIAGNOSIS — I2584 Coronary atherosclerosis due to calcified coronary lesion: Secondary | ICD-10-CM | POA: Insufficient documentation

## 2023-08-06 DIAGNOSIS — E669 Obesity, unspecified: Secondary | ICD-10-CM | POA: Insufficient documentation

## 2023-08-06 DIAGNOSIS — I4819 Other persistent atrial fibrillation: Secondary | ICD-10-CM | POA: Diagnosis not present

## 2023-08-06 DIAGNOSIS — K219 Gastro-esophageal reflux disease without esophagitis: Secondary | ICD-10-CM | POA: Insufficient documentation

## 2023-08-06 DIAGNOSIS — E785 Hyperlipidemia, unspecified: Secondary | ICD-10-CM | POA: Diagnosis not present

## 2023-08-06 DIAGNOSIS — K449 Diaphragmatic hernia without obstruction or gangrene: Secondary | ICD-10-CM | POA: Diagnosis not present

## 2023-08-06 DIAGNOSIS — I2510 Atherosclerotic heart disease of native coronary artery without angina pectoris: Secondary | ICD-10-CM | POA: Insufficient documentation

## 2023-08-06 LAB — THYROID PANEL WITH TSH
Free Thyroxine Index: 1.4 (ref 1.2–4.9)
T3 Uptake Ratio: 25 % (ref 24–39)
T4, Total: 5.4 ug/dL (ref 4.5–12.0)
TSH: 3.13 u[IU]/mL (ref 0.450–4.500)

## 2023-08-06 MED ORDER — ROSUVASTATIN CALCIUM 10 MG PO TABS: 10.0000 mg | ORAL_TABLET | Freq: Every day | ORAL | 1 refills | Status: DC

## 2023-08-06 MED ORDER — METOPROLOL SUCCINATE ER 50 MG PO TB24: 75.0000 mg | ORAL_TABLET | Freq: Every day | ORAL | 3 refills | Status: DC

## 2023-08-06 NOTE — Patient Instructions (Addendum)
Medication Instructions:  Your physician has recommended you make the following change in your medication:  Start taking Metoprolol Succinate 75 Mg Daily Start taking Crestor 10 Mg daily  Continue all other medications as prescribed   Labwork: None  Testing/Procedures: None  Follow-Up: Your physician recommends that you schedule a follow-up appointment in: 4-6 weeks telephone visit with Philis Nettle   Any Other Special Instructions Will Be Listed Below (If Applicable).  If you need a refill on your cardiac medications before your next appointment, please call your pharmacy.

## 2023-08-06 NOTE — Telephone Encounter (Signed)
  Patient Consent for Virtual Visit        [name] has provided verbal consent on 08/06/2023 for a virtual visit (video or telephone).   CONSENT FOR VIRTUAL VISIT FOR:  [name]  By participating in this virtual visit I agree to the following:  I hereby voluntarily request, consent and authorize [facility] and its employed or contracted physicians, physician assistants, nurse practitioners or other licensed health care professionals (the Practitioner), to provide me with telemedicine health care services (the "Services") as deemed necessary by the treating Practitioner. I acknowledge and consent to receive the Services by the Practitioner via telemedicine. I understand that the telemedicine visit will involve communicating with the Practitioner through live audiovisual communication technology and the disclosure of certain medical information by electronic transmission. I acknowledge that I have been given the opportunity to request an in-person assessment or other available alternative prior to the telemedicine visit and am voluntarily participating in the telemedicine visit.  I understand that I have the right to withhold or withdraw my consent to the use of telemedicine in the course of my care at any time, without affecting my right to future care or treatment, and that the Practitioner or I may terminate the telemedicine visit at any time. I understand that I have the right to inspect all information obtained and/or recorded in the course of the telemedicine visit and may receive copies of available information for a reasonable fee.  I understand that some of the potential risks of receiving the Services via telemedicine include:  Delay or interruption in medical evaluation due to technological equipment failure or disruption; Information transmitted may not be sufficient (e.g. poor resolution of images) to allow for appropriate medical decision making by the Practitioner;  and/or  In rare instances, security protocols could fail, causing a breach of personal health information.  Furthermore, I acknowledge that it is my responsibility to provide information about my medical history, conditions and care that is complete and accurate to the best of my ability. I acknowledge that Practitioner's advice, recommendations, and/or decision may be based on factors not within their control, such as incomplete or inaccurate data provided by me or distortions of diagnostic images or specimens that may result from electronic transmissions. I understand that the practice of medicine is not an exact science and that Practitioner makes no warranties or guarantees regarding treatment outcomes. I acknowledge that a copy of this consent can be made available to me via my patient portal [facility] MyChart), or I can request a printed copy by calling the office of  [facility].    I understand that my insurance will be billed for this visit.   I have read or had this consent read to me. I understand the contents of this consent, which adequately explains the benefits and risks of the Services being provided via telemedicine.  I have been provided ample opportunity to ask questions regarding this consent and the Services and have had my questions answered to my satisfaction. I give my informed consent for the services to be provided through the use of telemedicine in my medical care

## 2023-08-13 ENCOUNTER — Ambulatory Visit: Payer: Medicare Other | Attending: Cardiovascular Disease | Admitting: *Deleted

## 2023-08-13 DIAGNOSIS — I480 Paroxysmal atrial fibrillation: Secondary | ICD-10-CM | POA: Diagnosis not present

## 2023-08-13 DIAGNOSIS — Z5181 Encounter for therapeutic drug level monitoring: Secondary | ICD-10-CM | POA: Diagnosis not present

## 2023-08-13 LAB — POCT INR: INR: 3.6 — AB (ref 2.0–3.0)

## 2023-08-13 NOTE — Patient Instructions (Signed)
No DOAC at this time.  On tegratol and lyrica Hold warfarin tonight then decrease dose to 1 tablets daily except 2 tablets on Mondays, Wednesdays and Fridays  Recheck in 3 wks

## 2023-08-27 ENCOUNTER — Other Ambulatory Visit: Payer: Self-pay | Admitting: Cardiovascular Disease

## 2023-08-27 DIAGNOSIS — I4891 Unspecified atrial fibrillation: Secondary | ICD-10-CM

## 2023-08-28 NOTE — Telephone Encounter (Signed)
Warfarin 5mg  refill Afib Last INR 08/13/23 Last OV 08/06/23

## 2023-08-31 ENCOUNTER — Other Ambulatory Visit: Payer: Self-pay | Admitting: Family Medicine

## 2023-09-03 ENCOUNTER — Telehealth: Payer: Self-pay | Admitting: Nurse Practitioner

## 2023-09-03 ENCOUNTER — Ambulatory Visit: Payer: Medicare Other | Attending: Nurse Practitioner | Admitting: Nurse Practitioner

## 2023-09-03 ENCOUNTER — Encounter: Payer: Self-pay | Admitting: Nurse Practitioner

## 2023-09-03 VITALS — BP 152/112 | HR 82

## 2023-09-03 DIAGNOSIS — E785 Hyperlipidemia, unspecified: Secondary | ICD-10-CM | POA: Diagnosis not present

## 2023-09-03 DIAGNOSIS — R002 Palpitations: Secondary | ICD-10-CM

## 2023-09-03 DIAGNOSIS — I2510 Atherosclerotic heart disease of native coronary artery without angina pectoris: Secondary | ICD-10-CM

## 2023-09-03 DIAGNOSIS — I10 Essential (primary) hypertension: Secondary | ICD-10-CM

## 2023-09-03 DIAGNOSIS — I4819 Other persistent atrial fibrillation: Secondary | ICD-10-CM

## 2023-09-03 NOTE — Telephone Encounter (Signed)
  Patient Consent for Virtual Visit        [name] has provided verbal consent on 09/03/2023 for a virtual visit (video or telephone).   CONSENT FOR VIRTUAL VISIT FOR:  [name]  By participating in this virtual visit I agree to the following:  I hereby voluntarily request, consent and authorize [facility] and its employed or contracted physicians, physician assistants, nurse practitioners or other licensed health care professionals (the Practitioner), to provide me with telemedicine health care services (the "Services") as deemed necessary by the treating Practitioner. I acknowledge and consent to receive the Services by the Practitioner via telemedicine. I understand that the telemedicine visit will involve communicating with the Practitioner through live audiovisual communication technology and the disclosure of certain medical information by electronic transmission. I acknowledge that I have been given the opportunity to request an in-person assessment or other available alternative prior to the telemedicine visit and am voluntarily participating in the telemedicine visit.  I understand that I have the right to withhold or withdraw my consent to the use of telemedicine in the course of my care at any time, without affecting my right to future care or treatment, and that the Practitioner or I may terminate the telemedicine visit at any time. I understand that I have the right to inspect all information obtained and/or recorded in the course of the telemedicine visit and may receive copies of available information for a reasonable fee.  I understand that some of the potential risks of receiving the Services via telemedicine include:  Delay or interruption in medical evaluation due to technological equipment failure or disruption; Information transmitted may not be sufficient (e.g. poor resolution of images) to allow for appropriate medical decision making by the Practitioner;  and/or  In rare instances, security protocols could fail, causing a breach of personal health information.  Furthermore, I acknowledge that it is my responsibility to provide information about my medical history, conditions and care that is complete and accurate to the best of my ability. I acknowledge that Practitioner's advice, recommendations, and/or decision may be based on factors not within their control, such as incomplete or inaccurate data provided by me or distortions of diagnostic images or specimens that may result from electronic transmissions. I understand that the practice of medicine is not an exact science and that Practitioner makes no warranties or guarantees regarding treatment outcomes. I acknowledge that a copy of this consent can be made available to me via my patient portal [facility] MyChart), or I can request a printed copy by calling the office of [facility].    I understand that my insurance will be billed for this visit.   I have read or had this consent read to me. I understand the contents of this consent, which adequately explains the benefits and risks of the Services being provided via telemedicine.  I have been provided ample opportunity to ask questions regarding this consent and the Services and have had my questions answered to my satisfaction. I give my informed consent for the services to be provided through the use of telemedicine in my medical care

## 2023-09-03 NOTE — Progress Notes (Signed)
Virtual Visit via Telephone Note   Because of [name] co-morbid illnesses, she is at least at moderate risk for complications without adequate follow up.  This format is felt to be most appropriate for this patient at this time.  The patient did not have access to video technology/had technical difficulties with video requiring transitioning to audio format only (telephone).  All issues noted in this document were discussed and addressed.  No physical exam could be performed with this format.  Please refer to the patient's chart for her consent to telehealth for [facility].    Date:  09/03/2023   ID:  [name], DOB February 13, 1951, MRN [id number] The patient was identified using 2 identifiers.  Patient Location: Home Provider Location: Office/Clinic   PCP:  Tommie Sams, DO   [facility] Providers Cardiologist:  Charlton Haws, MD     Evaluation Performed:  Follow-Up Visit  Chief Complaint:  A-fib follow-up   History of Present Illness:    [name] is a 72 y.o. female with PMH of coronary artery calcification, atypical chest pain, PAF, HTN, palpitations, GERD, reflux, lung cancer, s/p left thoracotomy, hiatal hernia, MS, trigeminal neuralgia, and self reported hx of meningioma who presents today for scheduled telephone follow-up.    Last saw Dr. Eden Emms 1 year ago. Was mainly sedentary d/t MS.  Toprol XL increased to 50 mg daily for better rate control with A-fib.    Seen for follow-up on June 23, 2023. Admitted labile HR and BP readings and noticed palpitations/ "jerky" sensation. Admitted to labile BP readings at home, uses a wrist cuff. SBP ranging from 70-120. Denied any chest pain, but admits to indigestion symptoms as well as atypical sensation of right sharp chest pain that she attributed to her MS symptoms. Noted some shortness of breath at times with exertion. Denied any syncope, presyncope, dizziness, orthopnea, PND, swelling or significant  weight changes, acute bleeding, or claudication. Monitor arranged and report noted below. EKG revealed rate controlled A-fib.    At last visit with me, she noted palpitations/sensation of "skipped beats" Tells me she feels as though "it doesn't seem to be straightening out." Denied any chest pain or shortness of breath. Admitted to some epigastric pain/reflux/GERD symptoms. Swelling improved from last office visit per her report. Denied any syncope, presyncope, dizziness, orthopnea, PND, acute bleeding, or claudication. Up about 25 lbs since last year.   Today she presents for telephone office visit. Says her palpitations are a somewhat better after Metoprolol dosage was increased after last office visit. While on the phone, she goes over her HR readings, overall showing well controlled heart rates and BP readings, with recent SBP averaging 150. Denies any chest pain, shortness of breath, syncope, presyncope, dizziness, orthopnea, PND, swelling or significant weight changes, acute bleeding, or claudication.   ROS:   Please see the history of present illness.    All other systems reviewed and are negative.   Prior CV studies:   The following studies were reviewed today:  Monitor 06/2023: Patch Wear Time:  13 days and 23 hours (2024-07-23T13:56:45-0400 to 2024-08-06T13:56:37-0400)   Atrial Fibrillation occurred continuously (100% burden), ranging from 60-187 bpm (avg of 94 bpm). Isolated VEs were rare (<1.0%), VE Couplets were rare (<1.0%), and no VE Triplets were present.    Charlton Haws MD [facility]  Labs/Other Tests and Data Reviewed:    EKG:  EKG is not ordered today.   Risk Assessment/Calculations:    CHA2DS2-VASc Score =  2   This indicates a 2.2% annual risk of stroke. The patient's score is based upon: CHF History: 0 HTN History: 0 Diabetes History: 0 Stroke History: 0 Vascular Disease History: 0 Age Score: 1 Gender Score: 1   Objective:    Vital Signs:  BP (!) 152/112    Pulse 82   SpO2 98%    Due to nature of today's visit, a physical exam was unable to be performed.   ASSESSMENT & PLAN:    Persistent A-fib, palpitations Notes improvement in palpitations, HR readings from home show overall well controlled HR. Thyroid panel and electrolytes WNL. Discussed avoiding triggers to A-fib. Also discussed treatment options for A-fib, including DCCV. Compliant with Coumadin and follow-up [facility].  Continue follow-up with Coumadin clinic as scheduled, denies any bleeding issues. Will arrange nurse visit in 1-2 weeks to obtain EKG and BP to see if Metoprolol can be increased to 100 mg daily. Consider DCCV in future if medication management does not improve her symptoms. Recommended [employer]ia mobile app.   Coronary artery calcification, HLD Stable with no anginal symptoms. No indication for ischemic evaluation.  Not on ASA d/t being on Coumadin. At last visit, wrote Rx for Crestor 10 mg daily d/t HLD, instructed to start this and to let us know if she develops any myalgias. At next visit, will discuss getting FLP/LFT. Heart healthy diet and regular cardiovascular exercise encouraged.   3. HTN SBP averaging 150's recently. However do not feel that she is getting accurate readings with her current monitor. Will bring her back in 1-2 weeks for RN/CMA visit for accuracy check, instructed to bring her device. Continue current medication regimen. Heart healthy diet and regular cardiovascular exercise encouraged.   Time:   Today, I have spent 22 minutes with the patient with telehealth technology discussing the above problems.     Medication Adjustments/Labs and Tests Ordered: Current medicines are reviewed at length with the patient today.  Concerns regarding medicines are outlined above.   Tests Ordered: No orders of the defined types were placed in this encounter.   Medication Changes: No orders of the defined types were placed in this encounter.   Follow Up:   In Person in 4-6 week(s)  Signed, Sharlene Dory, NP  09/03/2023 2:42 PM    [facility]

## 2023-09-03 NOTE — Patient Instructions (Signed)
Medication Instructions:  Your physician recommends that you continue on your current medications as directed. Please refer to the Current Medication list given to you today.  Labwork: None  Testing/Procedures: None  Follow-Up: Your physician recommends that you schedule a follow-up appointment in: 1-2 weeks nurse visit for BP check and EKG (Please bring BP monitor to have it calibrated)  4-6 week follow up   Any Other Special Instructions Will Be Listed Below (If Applicable).  [employer]ia Mobile [employer]: Website: [email or web address]  DR. Cristal Deer RECOMMENDS YOU PURCHASE  " Kardia" [employer] FROM THE  [employer]  APP PLAY STORE.  THE APP IS FREE , BUT THE  EQUIPMENT HAS A COST. IT ALLOWS YOU TO OBTAIN A RECORDING OF YOUR HEART RATE AND RHYTHM BY PROVIDING A SHORT STRIP THAT YOU CAN SHARE WITH YOUR PROVIDER.    If you need a refill on your cardiac medications before your next appointment, please call your pharmacy.

## 2023-09-04 ENCOUNTER — Other Ambulatory Visit: Payer: Self-pay | Admitting: Family Medicine

## 2023-09-04 DIAGNOSIS — Z1211 Encounter for screening for malignant neoplasm of colon: Secondary | ICD-10-CM

## 2023-09-04 DIAGNOSIS — Z1212 Encounter for screening for malignant neoplasm of rectum: Secondary | ICD-10-CM

## 2023-09-09 ENCOUNTER — Ambulatory Visit: Payer: Medicare Other | Attending: Cardiovascular Disease | Admitting: *Deleted

## 2023-09-09 DIAGNOSIS — I480 Paroxysmal atrial fibrillation: Secondary | ICD-10-CM | POA: Diagnosis not present

## 2023-09-09 DIAGNOSIS — Z5181 Encounter for therapeutic drug level monitoring: Secondary | ICD-10-CM | POA: Diagnosis not present

## 2023-09-09 LAB — POCT INR: INR: 2 (ref 2.0–3.0)

## 2023-09-09 NOTE — Patient Instructions (Signed)
No DOAC at this time.  On tegratol and lyrica Continue warfarin 1 tablet daily except 2 tablets on Mondays, Wednesdays and Fridays  Recheck in 4 wks

## 2023-09-10 ENCOUNTER — Ambulatory Visit: Payer: Medicare Other

## 2023-09-25 ENCOUNTER — Ambulatory Visit (INDEPENDENT_AMBULATORY_CARE_PROVIDER_SITE_OTHER): Payer: Medicare Other

## 2023-09-25 VITALS — Ht 66.0 in | Wt 220.0 lb

## 2023-09-25 DIAGNOSIS — Z0000 Encounter for general adult medical examination without abnormal findings: Secondary | ICD-10-CM

## 2023-09-25 DIAGNOSIS — Z2821 Immunization not carried out because of patient refusal: Secondary | ICD-10-CM

## 2023-09-25 DIAGNOSIS — Z789 Other specified health status: Secondary | ICD-10-CM

## 2023-09-25 DIAGNOSIS — Z5320 Procedure and treatment not carried out because of patient's decision for unspecified reasons: Secondary | ICD-10-CM

## 2023-09-25 NOTE — Patient Instructions (Signed)
Ms. Hullum , Thank you for taking time to come for your Medicare Wellness Visit. I appreciate your ongoing commitment to your health goals. Please review the following plan we discussed and let me know if I can assist you in the future.   Referrals/Orders/Follow-Ups/Clinician Recommendations:  Next Medicare Annual Wellness Visit: September 30, 2024 at 8:00am virtual visit  Vaccinations: declines all Influenza vaccine: recommend every Fall Pneumococcal vaccine: recommend once per lifetime Prevnar-20 Tdap vaccine: recommend every 10 years Shingles vaccine: recommend Shingrix which is 2 doses 2-6 months apart and over 90% effective     Covid-19: recommend 2 doses one month apart with a booster 6 months later   This is a list of the screening recommended for you and due dates:  Health Maintenance  Topic Date Due   Hepatitis C Screening  Never done   DTaP/Tdap/Td vaccine (1 - Tdap) Never done   Zoster (Shingles) Vaccine (1 of 2) Never done   Cologuard (Stool DNA test)  Never done   Mammogram  Never done   Pneumonia Vaccine (1 of 1 - PCV) Never done   Flu Shot  Never done   DEXA scan (bone density measurement)  09/01/2024   Medicare Annual Wellness Visit  09/24/2024   HPV Vaccine  Aged Out   Colon Cancer Screening  Discontinued   COVID-19 Vaccine  Discontinued    Advanced directives: (Declined) Advance directive discussed with you today. Even though you declined this today, please call our office should you change your mind, and we can give you the proper paperwork for you to fill out.  Next Medicare Annual Wellness Visit scheduled for next year: Yes  Preventive Care 39 Years and Older, Female Preventive care refers to lifestyle choices and visits with your health care provider that can promote health and wellness. Preventive care visits are also called wellness exams. What can I expect for my preventive care visit? Counseling Your health care provider may ask you questions about  your: Medical history, including: Past medical problems. Family medical history. Pregnancy and menstrual history. History of falls. Current health, including: Memory and ability to understand (cognition). Emotional well-being. Home life and relationship well-being. Sexual activity and sexual health. Lifestyle, including: Alcohol, nicotine or tobacco, and drug use. Access to firearms. Diet, exercise, and sleep habits. Work and work Astronomer. Sunscreen use. Safety issues such as seatbelt and bike helmet use. Physical exam Your health care provider will check your: Height and weight. These may be used to calculate your BMI (body mass index). BMI is a measurement that tells if you are at a healthy weight. Waist circumference. This measures the distance around your waistline. This measurement also tells if you are at a healthy weight and may help predict your risk of certain diseases, such as type 2 diabetes and high blood pressure. Heart rate and blood pressure. Body temperature. Skin for abnormal spots. What immunizations do I need?  Vaccines are usually given at various ages, according to a schedule. Your health care provider will recommend vaccines for you based on your age, medical history, and lifestyle or other factors, such as travel or where you work. What tests do I need? Screening Your health care provider may recommend screening tests for certain conditions. This may include: Lipid and cholesterol levels. Hepatitis C test. Hepatitis B test. HIV (human immunodeficiency virus) test. STI (sexually transmitted infection) testing, if you are at risk. Lung cancer screening. Colorectal cancer screening. Diabetes screening. This is done by checking your blood sugar (glucose)  after you have not eaten for a while (fasting). Mammogram. Talk with your health care provider about how often you should have regular mammograms. BRCA-related cancer screening. This may be done if you  have a family history of breast, ovarian, tubal, or peritoneal cancers. Bone density scan. This is done to screen for osteoporosis. Talk with your health care provider about your test results, treatment options, and if necessary, the need for more tests. Follow these instructions at home: Eating and drinking  Eat a diet that includes fresh fruits and vegetables, whole grains, lean protein, and low-fat dairy products. Limit your intake of foods with high amounts of sugar, saturated fats, and salt. Take vitamin and mineral supplements as recommended by your health care provider. Do not drink alcohol if your health care provider tells you not to drink. If you drink alcohol: Limit how much you have to 0-1 drink a day. Know how much alcohol is in your drink. In the [place] one drink equals one 12 oz bottle of beer (355 mL), one 5 oz glass of wine (148 mL), or one 1 oz glass of hard liquor (44 mL). Lifestyle Brush your teeth every morning and night with fluoride toothpaste. Floss one time each day. Exercise for at least 30 minutes 5 or more days each week. Do not use any products that contain nicotine or tobacco. These products include cigarettes, chewing tobacco, and vaping devices, such as e-cigarettes. If you need help quitting, ask your health care provider. Do not use drugs. If you are sexually active, practice safe sex. Use a condom or other form of protection in order to prevent STIs. Take aspirin only as told by your health care provider. Make sure that you understand how much to take and what form to take. Work with your health care provider to find out whether it is safe and beneficial for you to take aspirin daily. Ask your health care provider if you need to take a cholesterol-lowering medicine (statin). Find healthy ways to manage stress, such as: Meditation, yoga, or listening to music. Journaling. Talking to a trusted person. Spending time with friends and family. Minimize exposure  to UV radiation to reduce your risk of skin cancer. Safety Always wear your seat belt while driving or riding in a vehicle. Do not drive: If you have been drinking alcohol. Do not ride with someone who has been drinking. When you are tired or distracted. While texting. If you have been using any mind-altering substances or drugs. Wear a helmet and other protective equipment during sports activities. If you have firearms in your house, make sure you follow all gun safety procedures. What's next? Visit your health care provider once a year for an annual wellness visit. Ask your health care provider how often you should have your eyes and teeth checked. Stay up to date on all vaccines. This information is not intended to replace advice given to you by your health care provider. Make sure you discuss any questions you have with your health care provider. Document Revised: 05/15/2021 Document Reviewed: 05/15/2021 Elsevier Patient Education  2024 [employer]. Understanding Your Risk for Falls Millions of people have serious injuries from falls each year. It is important to understand your risk of falling. Talk with your health care provider about your risk and what you can do to lower it. If you do have a serious fall, make sure to tell your provider. Falling once raises your risk of falling again. How can falls affect me? Serious injuries  from falls are common. These include: Broken bones, such as hip fractures. Head injuries, such as traumatic brain injuries (TBI) or concussions. A fear of falling can cause you to avoid activities and stay at home. This can make your muscles weaker and raise your risk for a fall. What can increase my risk? There are a number of risk factors that increase your risk for falling. The more risk factors you have, the higher your risk of falling. Serious injuries from a fall happen most often to people who are older than 73 years old. Teenagers and young adults  ages 24-29 are also at higher risk. Common risk factors include: Weakness in the lower body. Being generally weak or confused due to long-term (chronic) illness. Dizziness or balance problems. Poor vision. Medicines that cause dizziness or drowsiness. These may include: Medicines for your blood pressure, heart, anxiety, insomnia, or swelling (edema). Pain medicines. Muscle relaxants. Other risk factors include: Drinking alcohol. Having had a fall in the past. Having foot pain or wearing improper footwear. Working at a dangerous job. Having any of the following in your home: Tripping hazards, such as floor clutter or loose rugs. Poor lighting. Pets. Having dementia or memory loss. What actions can I take to lower my risk of falling?     Physical activity Stay physically fit. Do strength and balance exercises. Consider taking a regular class to build strength and balance. Yoga and tai chi are good options. Vision Have your eyes checked every year and your prescription for glasses or contacts updated as needed. Shoes and walking aids Wear non-skid shoes. Wear shoes that have rubber soles and low heels. Do not wear high heels. Do not walk around the house in socks or slippers. Use a cane or walker as told by your provider. Home safety Attach secure railings on both sides of your stairs. Install grab bars for your bathtub, shower, and toilet. Use a non-skid mat in your bathtub or shower. Attach bath mats securely with double-sided, non-slip rug tape. Use good lighting in all rooms. Keep a flashlight near your bed. Make sure there is a clear path from your bed to the bathroom. Use night-lights. Do not use throw rugs. Make sure all carpeting is taped or tacked down securely. Remove all clutter from walkways and stairways, including extension cords. Repair uneven or broken steps and floors. Avoid walking on icy or slippery surfaces. Walk on the grass instead of on icy or slick  sidewalks. Use ice melter to get rid of ice on walkways in the winter. Use a cordless phone. Questions to ask your health care provider Can you help me check my risk for a fall? Do any of my medicines make me more likely to fall? Should I take a vitamin D supplement? What exercises can I do to improve my strength and balance? Should I make an appointment to have my vision checked? Do I need a bone density test to check for weak bones (osteoporosis)? Would it help to use a cane or a walker? Where to find more information Centers for Disease Control and Prevention, [employer]: TonerPromos.no Community-Based Fall Prevention Programs: TonerPromos.no [employer] on Aging: BaseRingTones.pl Contact a health care provider if: You fall at home. You are afraid of falling at home. You feel weak, drowsy, or dizzy. This information is not intended to replace advice given to you by your health care provider. Make sure you discuss any questions you have with your health care provider. Document Revised: 07/21/2022 Document Reviewed: 07/21/2022  Elsevier Patient Education  2024 [employer]. Managing Pain Without Opioids Opioids are strong medicines used to treat moderate to severe pain. For some people, especially those who have long-term (chronic) pain, opioids may not be the best choice for pain management due to: Side effects like nausea, constipation, and sleepiness. The risk of addiction (opioid use disorder). The longer you take opioids, the greater your risk of addiction. Pain that lasts for more than 3 months is called chronic pain. Managing chronic pain usually requires more than one approach and is often provided by a team of health care providers working together (multidisciplinary approach). Pain management may be done at a pain management center or pain clinic. How to manage pain without the use of opioids Use non-opioid medicines Non-opioid medicines for pain may include: Over-the-counter or prescription  non-steroidal anti-inflammatory drugs (NSAIDs). These may be the first medicines used for pain. They work well for muscle and bone pain, and they reduce swelling. Acetaminophen. This over-the-counter medicine may work well for milder pain but not swelling. Antidepressants. These may be used to treat chronic pain. A certain type of antidepressant (tricyclics) is often used. These medicines are given in lower doses for pain than when used for depression. Anticonvulsants. These are usually used to treat seizures but may also reduce nerve (neuropathic) pain. Muscle relaxants. These relieve pain caused by sudden muscle tightening (spasms). You may also use a pain medicine that is applied to the skin as a patch, cream, or gel (topical analgesic), such as a numbing medicine. These may cause fewer side effects than medicines taken by mouth. Do certain therapies as directed Some therapies can help with pain management. They include: Physical therapy. You will do exercises to gain strength and flexibility. A physical therapist may teach you exercises to move and stretch parts of your body that are weak, stiff, or painful. You can learn these exercises at physical therapy visits and practice them at home. Physical therapy may also involve: Massage. Heat wraps or applying heat or cold to affected areas. Electrical signals that interrupt pain signals (transcutaneous electrical nerve stimulation, TENS). Weak lasers that reduce pain and swelling (low-level laser therapy). Signals from your body that help you learn to regulate pain (biofeedback). Occupational therapy. This helps you to learn ways to function at home and work with less pain. Recreational therapy. This involves trying new activities or hobbies, such as a physical activity or drawing. Mental health therapy, including: Cognitive behavioral therapy (CBT). This helps you learn coping skills for dealing with pain. Acceptance and commitment therapy (ACT)  to change the way you think and react to pain. Relaxation therapies, including muscle relaxation exercises and mindfulness-based stress reduction. Pain management counseling. This may be individual, family, or group counseling.  Receive medical treatments Medical treatments for pain management include: Nerve block injections. These may include a pain blocker and anti-inflammatory medicines. You may have injections: Near the spine to relieve chronic back or neck pain. Into joints to relieve back or joint pain. Into nerve areas that supply a painful area to relieve body pain. Into muscles (trigger point injections) to relieve some painful muscle conditions. A medical device placed near your spine to help block pain signals and relieve nerve pain or chronic back pain (spinal cord stimulation device). Acupuncture. Follow these instructions at home Medicines Take over-the-counter and prescription medicines only as told by your health care provider. If you are taking pain medicine, ask your health care providers about possible side effects to watch out for.  Do not drive or use heavy machinery while taking prescription opioid pain medicine. Lifestyle  Do not use drugs or alcohol to reduce pain. If you drink alcohol, limit how much you have to: 0-1 drink a day for women who are not pregnant. 0-2 drinks a day for men. Know how much alcohol is in a drink. In the [place] one drink equals one 12 oz bottle of beer (355 mL), one 5 oz glass of wine (148 mL), or one 1 oz glass of hard liquor (44 mL). Do not use any products that contain nicotine or tobacco. These products include cigarettes, chewing tobacco, and vaping devices, such as e-cigarettes. If you need help quitting, ask your health care provider. Eat a healthy diet and maintain a healthy weight. Poor diet and excess weight may make pain worse. Eat foods that are high in fiber. These include fresh fruits and vegetables, whole grains, and  beans. Limit foods that are high in fat and processed sugars, such as fried and sweet foods. Exercise regularly. Exercise lowers stress and may help relieve pain. Ask your health care provider what activities and exercises are safe for you. If your health care provider approves, join an exercise class that combines movement and stress reduction. Examples include yoga and tai chi. Get enough sleep. Lack of sleep may make pain worse. Lower stress as much as possible. Practice stress reduction techniques as told by your therapist. General instructions Work with all your pain management providers to find the treatments that work best for you. You are an important member of your pain management team. There are many things you can do to reduce pain on your own. Consider joining an online or in-person support group for people who have chronic pain. Keep all follow-up visits. This is important. Where to find more information You can find more information about managing pain without opioids from: American Academy of Pain Medicine: painmed.org Institute for Chronic Pain: instituteforchronicpain.org American Chronic Pain Association: theacpa.org Contact a health care provider if: You have side effects from pain medicine. Your pain gets worse or does not get better with treatments or home therapy. You are struggling with anxiety or depression. Summary Many types of pain can be managed without opioids. Chronic pain may respond better to pain management without opioids. Pain is best managed when you and a team of health care providers work together. Pain management without opioids may include non-opioid medicines, medical treatments, physical therapy, mental health therapy, and lifestyle changes. Tell your health care providers if your pain gets worse or is not being managed well enough. This information is not intended to replace advice given to you by your health care provider. Make sure you discuss any  questions you have with your health care provider. Document Revised: 02/27/2021 Document Reviewed: 02/27/2021 Elsevier Patient Education  2024 [employer].
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
## 2023-09-30 NOTE — Progress Notes (Signed)
I agree with the management as indicated in the note.  Peityn Payton DO [facility]  

## 2023-10-01 DIAGNOSIS — M797 Fibromyalgia: Secondary | ICD-10-CM | POA: Diagnosis not present

## 2023-10-01 DIAGNOSIS — M47816 Spondylosis without myelopathy or radiculopathy, lumbar region: Secondary | ICD-10-CM | POA: Diagnosis not present

## 2023-10-01 DIAGNOSIS — M2550 Pain in unspecified joint: Secondary | ICD-10-CM | POA: Diagnosis not present

## 2023-10-01 DIAGNOSIS — Z79891 Long term (current) use of opiate analgesic: Secondary | ICD-10-CM | POA: Diagnosis not present

## 2023-10-01 DIAGNOSIS — G894 Chronic pain syndrome: Secondary | ICD-10-CM | POA: Diagnosis not present

## 2023-10-01 DIAGNOSIS — M4316 Spondylolisthesis, lumbar region: Secondary | ICD-10-CM | POA: Diagnosis not present

## 2023-10-07 ENCOUNTER — Ambulatory Visit: Payer: Medicare Other

## 2023-10-15 ENCOUNTER — Ambulatory Visit: Payer: Medicare Other | Attending: Cardiovascular Disease | Admitting: *Deleted

## 2023-10-15 DIAGNOSIS — I480 Paroxysmal atrial fibrillation: Secondary | ICD-10-CM | POA: Diagnosis not present

## 2023-10-15 DIAGNOSIS — Z5181 Encounter for therapeutic drug level monitoring: Secondary | ICD-10-CM | POA: Diagnosis not present

## 2023-10-15 LAB — POCT INR: INR: 2.8 (ref 2.0–3.0)

## 2023-10-15 NOTE — Patient Instructions (Signed)
No DOAC at this time.  On tegratol and lyrica Continue warfarin 1 tablet daily except 2 tablets on Mondays, Wednesdays and Fridays  Recheck in 5 wks

## 2023-10-26 ENCOUNTER — Ambulatory Visit: Payer: Medicare Other | Admitting: Nurse Practitioner
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
## 2023-11-09 ENCOUNTER — Ambulatory Visit: Payer: Medicare Other | Admitting: Family Medicine

## 2023-11-09 ENCOUNTER — Encounter: Payer: Self-pay | Admitting: Neurology

## 2023-11-09 ENCOUNTER — Ambulatory Visit (INDEPENDENT_AMBULATORY_CARE_PROVIDER_SITE_OTHER): Payer: Medicare Other | Admitting: Neurology

## 2023-11-09 VITALS — BP 156/80 | HR 96 | Ht 66.0 in | Wt 230.0 lb

## 2023-11-09 DIAGNOSIS — G500 Trigeminal neuralgia: Secondary | ICD-10-CM | POA: Diagnosis not present

## 2023-11-09 DIAGNOSIS — G35 Multiple sclerosis: Secondary | ICD-10-CM

## 2023-11-09 NOTE — Patient Instructions (Signed)
Carbamazepine 200mg  twice daily (third one as needed for facial twinge) Pregablin 150mg  daily (second dose as needed for facial twinge)

## 2023-11-11 ENCOUNTER — Ambulatory Visit: Payer: Medicare Other | Admitting: Family Medicine

## 2023-11-27 ENCOUNTER — Other Ambulatory Visit: Payer: Self-pay | Admitting: Cardiovascular Disease

## 2023-11-27 DIAGNOSIS — I4891 Unspecified atrial fibrillation: Secondary | ICD-10-CM

## 2023-11-27 NOTE — Telephone Encounter (Signed)
Pt is requesting Warfarin  

## 2023-11-27 NOTE — Telephone Encounter (Signed)
Prescription refill request received for warfarin Lov: 08/06/23 Philis Nettle)  Next INR check: 12/08/23 Warfarin tablet strength: 5mg   Appropriate dose. Refill sent.

## 2023-12-04 ENCOUNTER — Telehealth: Payer: Self-pay | Admitting: Cardiovascular Disease

## 2023-12-04 NOTE — Telephone Encounter (Signed)
 Pt c/o medication issue:  1. Name of Medication: Metoprolol  ER 50mg   2. How are you currently taking this medication (dosage and times per day)? 1 /12 daily  3. Are you having a reaction (difficulty breathing--STAT)? no  4. What is your medication issue? PT is wondering if she can take 2 tablets daily due to the tablet crumbling when cutting. Pt is taking 1 tablet them waiting an hour or so then taking the 1/2. She stated that the 1 1/2 is working but she can still feel a little Afib.

## 2023-12-07 ENCOUNTER — Ambulatory Visit: Payer: Medicare Other | Admitting: Nurse Practitioner

## 2023-12-08 ENCOUNTER — Ambulatory Visit: Payer: Medicare Other | Attending: Cardiovascular Disease | Admitting: *Deleted

## 2023-12-08 DIAGNOSIS — I480 Paroxysmal atrial fibrillation: Secondary | ICD-10-CM | POA: Insufficient documentation

## 2023-12-08 DIAGNOSIS — Z5181 Encounter for therapeutic drug level monitoring: Secondary | ICD-10-CM | POA: Diagnosis not present

## 2023-12-08 LAB — POCT INR: INR: 2.6 (ref 2.0–3.0)

## 2023-12-08 MED ORDER — METOPROLOL SUCCINATE ER 100 MG PO TB24: 100.0000 mg | ORAL_TABLET | Freq: Every day | ORAL | Status: DC

## 2023-12-08 NOTE — Telephone Encounter (Signed)
 Per Provider:  That is fine to increase to 2 tablets daily after reviewing her HR and BP trends over the past office visits. She needs to continue to monitor her BP/HR at home and update us  in 1-2 weeks to see how she is doing.   Thanks!   Best,  [name], NP   Patient verbalized understanding. Updated her medication list she stated that she had enough medication for now to take 2 tablets

## 2023-12-08 NOTE — Patient Instructions (Signed)
 No DOAC at this time.  On tegratol and lyrica Continue warfarin 1 tablet daily except 2 tablets on Mondays, Wednesdays and Fridays  Recheck in 5 wks

## 2024-01-04 ENCOUNTER — Other Ambulatory Visit: Payer: Self-pay | Admitting: Nurse Practitioner

## 2024-01-13 ENCOUNTER — Ambulatory Visit: Payer: Medicare Other | Admitting: Nurse Practitioner

## 2024-02-03 ENCOUNTER — Ambulatory Visit: Payer: Medicare Other | Admitting: Family Medicine

## 2024-02-10 ENCOUNTER — Ambulatory Visit: Attending: Cardiovascular Disease | Admitting: *Deleted

## 2024-02-10 DIAGNOSIS — I480 Paroxysmal atrial fibrillation: Secondary | ICD-10-CM | POA: Insufficient documentation

## 2024-02-10 DIAGNOSIS — Z5181 Encounter for therapeutic drug level monitoring: Secondary | ICD-10-CM | POA: Diagnosis not present

## 2024-02-10 LAB — POCT INR: INR: 1.8 — AB (ref 2.0–3.0)

## 2024-02-10 NOTE — Patient Instructions (Signed)
 No DOAC at this time.  On tegratol and lyrica Take warfarin 2 tablets tonight and tomorrow night then resume 1 tablet daily except 2 tablets on Mondays, Wednesdays and Fridays  Recheck in 5 wks

## 2024-02-12 DIAGNOSIS — G894 Chronic pain syndrome: Secondary | ICD-10-CM | POA: Diagnosis not present

## 2024-02-12 DIAGNOSIS — M47816 Spondylosis without myelopathy or radiculopathy, lumbar region: Secondary | ICD-10-CM | POA: Diagnosis not present

## 2024-02-12 DIAGNOSIS — M2550 Pain in unspecified joint: Secondary | ICD-10-CM | POA: Diagnosis not present

## 2024-02-12 DIAGNOSIS — M4316 Spondylolisthesis, lumbar region: Secondary | ICD-10-CM | POA: Diagnosis not present

## 2024-02-12 DIAGNOSIS — Z79891 Long term (current) use of opiate analgesic: Secondary | ICD-10-CM | POA: Diagnosis not present

## 2024-02-12 DIAGNOSIS — M797 Fibromyalgia: Secondary | ICD-10-CM | POA: Diagnosis not present

## 2024-02-25 ENCOUNTER — Other Ambulatory Visit: Payer: Self-pay | Admitting: Cardiovascular Disease

## 2024-02-25 DIAGNOSIS — I4891 Unspecified atrial fibrillation: Secondary | ICD-10-CM

## 2024-02-25 NOTE — Telephone Encounter (Signed)
 Prescription refill request received for warfarin Lov: [name] 08/06/2023 Next INR check: 4/16  Warfarin tablet strength:5mg 

## 2024-03-01 ENCOUNTER — Ambulatory Visit: Payer: Medicare Other | Attending: Nurse Practitioner | Admitting: Nurse Practitioner

## 2024-03-01 ENCOUNTER — Encounter: Payer: Self-pay | Admitting: Nurse Practitioner

## 2024-03-01 VITALS — BP 120/70 | HR 80 | Ht 66.0 in | Wt 230.0 lb

## 2024-03-01 DIAGNOSIS — I2510 Atherosclerotic heart disease of native coronary artery without angina pectoris: Secondary | ICD-10-CM

## 2024-03-01 DIAGNOSIS — R002 Palpitations: Secondary | ICD-10-CM

## 2024-03-01 DIAGNOSIS — R110 Nausea: Secondary | ICD-10-CM

## 2024-03-01 DIAGNOSIS — I4891 Unspecified atrial fibrillation: Secondary | ICD-10-CM

## 2024-03-01 DIAGNOSIS — E785 Hyperlipidemia, unspecified: Secondary | ICD-10-CM | POA: Diagnosis not present

## 2024-03-01 DIAGNOSIS — I4811 Longstanding persistent atrial fibrillation: Secondary | ICD-10-CM

## 2024-03-01 DIAGNOSIS — I10 Essential (primary) hypertension: Secondary | ICD-10-CM | POA: Diagnosis not present

## 2024-03-01 NOTE — Patient Instructions (Addendum)
 Medication Instructions:  Your physician has recommended you make the following change in your medication:  Please restart Rosuvastatin at 10 Mg daily   Labwork: None   Testing/Procedures: None   Follow-Up: Your physician recommends that you schedule a follow-up appointment in: 2 months   Any Other Special Instructions Will Be Listed Below (If Applicable).  If you need a refill on your cardiac medications before your next appointment, please call your pharmacy.

## 2024-03-01 NOTE — Progress Notes (Unsigned)
 Cardiology Office Note:  .   Date:  03/01/2024  ID:  [name], DOB Apr 08, 1951, MRN [id number] PCP: Tommie Sams, DO  [facility] Providers Cardiologist:  Charlton Haws, MD    History of Present Illness: .   [name] is a 73 y.o. female with a PMH of coronary artery calcification, atypical chest pain, PAF, HTN, palpitations, GERD, reflux, lung cancer, s/p left thoracotomy, hiatal hernia, MS, trigeminal neuralgia, and self reported hx of meningioma who presents today for scheduled follow-up.   Last saw Dr. Eden Emms 1 year ago. Was mainly sedentary d/t MS.  Toprol XL increased to 50 mg daily for better rate control with A-fib.    Seen for follow-up on June 23, 2023. Admitted labile HR and BP readings and noticed palpitations/ "jerky" sensation. Admitted to labile BP readings at home, uses a wrist cuff. SBP ranging from 70-120. Denied any chest pain, but admits to indigestion symptoms as well as atypical sensation of right sharp chest pain that she attributed to her MS symptoms. Noted some shortness of breath at times with exertion. Denied any syncope, presyncope, dizziness, orthopnea, PND, swelling or significant weight changes, acute bleeding, or claudication. Monitor arranged and report noted below. EKG revealed rate controlled A-fib.    At last visit with me, she noted palpitations/sensation of "skipped beats" Tells me she feels as though "it doesn't seem to be straightening out." Denied any chest pain or shortness of breath. Admitted to some epigastric pain/reflux/GERD symptoms. Swelling improved from last office visit per her report. Denied any syncope, presyncope, dizziness, orthopnea, PND, acute bleeding, or claudication. Up about 25 lbs since last year.   09/03/2023 - Today she presents for telephone office visit. Says her palpitations are a somewhat better after Metoprolol dosage was increased after last office visit. While on the phone, she goes over her HR readings, overall  showing well controlled heart rates and BP readings, with recent SBP averaging 150. Denies any chest pain, shortness of breath, syncope, presyncope, dizziness, orthopnea, PND, swelling or significant weight changes, acute bleeding, or claudication.  03/01/2024 - Presents today for follow-up. Admits to daily sensation of "heart fluttering." Says she is not consistent with taking her Metoprolol and believes she is taking Topxol XL 100 mg BID. She admits to nausea and sensation of feeling sick, believes her MS symptoms are flaring and has not been taking her Crestor. Says she did not want to take this. Denies any chest pain, shortness of breath, syncope, presyncope, dizziness, orthopnea, PND, swelling or significant weight changes, acute bleeding, or claudication.   ROS: Negative. See HPI.   Studies Reviewed: Marland Kitchen   EKG Interpretation Date/Time:  Tuesday March 01 2024 13:19:01 EDT Ventricular Rate:  91 PR Interval:    QRS Duration:  78 QT Interval:  342 QTC Calculation: 420 R Axis:   -9  Text Interpretation: Atrial fibrillation Minimal voltage criteria for LVH, may be normal variant ( R in aVL ) Inferior infarct , age undetermined Cannot rule out Anterior infarct , age undetermined When compared with ECG of 23-Jun-2023 13:03, Minimal criteria for Anterior infarct are now Present Inferior infarct is now Present Inverted T waves have replaced nonspecific T wave abnormality in Inferior leads Nonspecific T wave abnormality now evident in Anterior leads Confirmed by Sharlene Dory [id number]) on 03/01/2024 1:21:11 PM    Monitor 06/2023: Patch Wear Time:  13 days and 23 hours (2024-07-23T13:56:45-0400 to 2024-08-06T13:56:37-0400)   Atrial Fibrillation occurred continuously (100% burden), ranging from 60-187 bpm (  avg of 94 bpm). Isolated VEs were rare (<1.0%), VE Couplets were rare (<1.0%), and no VE Triplets were present.    Charlton Haws MD [facility]  Risk Assessment/Calculations:    CHA2DS2-VASc Score = 2   This  indicates a 2.2% annual risk of stroke. The patient's score is based upon: CHF History: 0 HTN History: 0 Diabetes History: 0 Stroke History: 0 Vascular Disease History: 0 Age Score: 1 Gender Score: 1    Physical Exam:   VS:  BP 120/70   Pulse 80   Ht 5\' 6"  (1.676 m)   Wt 230 lb (104.3 kg)   SpO2 98%   BMI 37.12 kg/m    Wt Readings from Last 3 Encounters:  03/01/24 230 lb (104.3 kg)  11/09/23 230 lb (104.3 kg)  09/25/23 220 lb (99.8 kg)    GEN: Obese, 73 y.o. female in no acute distress NECK: No JVD; No carotid bruits CARDIAC: S1/S2, irregularly irregular rhythm, no murmurs, rubs, gallops RESPIRATORY:  Clear and diminished to auscultation without rales, wheezing or rhonchi  ABDOMEN: Soft, non-tender, non-distended EXTREMITIES:  No edema; No deformity   ASSESSMENT AND PLAN: .    Persistent A-fib, palpitations Notes daily palpitations and sensation of "heart fluttering." EKG shows rate controlled A-fib.  Have previously discussed avoiding triggers to A-fib. Compliant with Coumadin and [facility].  She believes she is taking Toprol-XL 100 mg twice daily.  Discussed importance of being consistent with medication.  She will call our office to verify which dosage of Toprol XL she is taking. Continue follow-up with Coumadin clinic as scheduled, denies any bleeding issues.  Discussed DCCV and recommended to consider this due to her persistent A-fib and being symptomatic with this, however patient declines and states she wants to wait and think about this. Care and ED precautions discussed.   Coronary artery calcification, HLD Stable with no anginal symptoms. No indication for ischemic evaluation.  Not on ASA d/t being on Coumadin. At last visit, wrote Rx for Crestor 10 mg daily d/t HLD, instructed to start this but pt says she did not start taking this. I have encouraged her to restart this. She will let us know if she develops any myalgias. At next visit, will discuss  getting FLP/LFT. Heart healthy diet and regular cardiovascular exercise encouraged.    HTN SBP stable. Continue current medication regimen. Heart healthy diet and regular cardiovascular exercise encouraged.   4. Nausea Instructed her to follow-up with her PCP and Neurologist regarding her symptoms.  She verbalized understanding.  Care and ED precautions discussed.      Dispo: Follow-up with me or APP in 2 months or sooner if needed.   Signed, Sharlene Dory, NP

## 2024-03-03 ENCOUNTER — Telehealth: Payer: Self-pay | Admitting: Nurse Practitioner

## 2024-03-03 MED ORDER — METOPROLOL SUCCINATE ER 100 MG PO TB24: 100.0000 mg | ORAL_TABLET | Freq: Every day | ORAL | Status: DC

## 2024-03-03 NOTE — Telephone Encounter (Signed)
 Pt c/o medication issue:  1. Name of Medication:   metoprolol succinate (TOPROL XL) 100 MG 24 hr tablet    2. How are you currently taking this medication (dosage and times per day)? 50 MG tablet twice daily  3. Are you having a reaction (difficulty breathing--STAT)? No  4. What is your medication issue? Pt states that at last visit she was told to c/b and make NP aware of how she is taking above medication being that she is taking different than prescribed. Please advise

## 2024-03-04 MED ORDER — METOPROLOL SUCCINATE ER 50 MG PO TB24: 75.0000 mg | ORAL_TABLET | Freq: Two times a day (BID) | ORAL | 3 refills | Status: DC

## 2024-03-04 NOTE — Telephone Encounter (Signed)
 Left message for patient to call back

## 2024-03-04 NOTE — Telephone Encounter (Signed)
 Patient informed and verbalized understanding of plan. New Rx sent in for patient.

## 2024-03-10 ENCOUNTER — Other Ambulatory Visit: Payer: Self-pay | Admitting: Family Medicine

## 2024-03-16 ENCOUNTER — Ambulatory Visit: Attending: Cardiovascular Disease | Admitting: *Deleted

## 2024-03-16 DIAGNOSIS — Z5181 Encounter for therapeutic drug level monitoring: Secondary | ICD-10-CM | POA: Diagnosis not present

## 2024-03-16 DIAGNOSIS — I480 Paroxysmal atrial fibrillation: Secondary | ICD-10-CM | POA: Insufficient documentation

## 2024-03-16 LAB — POCT INR: INR: 1.9 — AB (ref 2.0–3.0)

## 2024-03-16 NOTE — Patient Instructions (Signed)
 No DOAC at this time.  On tegratol and lyrica Increase warfarin to 2 tablets daily except 1 tablet on Sundays, Tuesdays and Thursdays  Recheck in 5 wks

## 2024-03-28 ENCOUNTER — Other Ambulatory Visit: Payer: Self-pay | Admitting: Cardiovascular Disease

## 2024-03-28 DIAGNOSIS — I4891 Unspecified atrial fibrillation: Secondary | ICD-10-CM

## 2024-03-28 NOTE — Telephone Encounter (Signed)
 Refill request for warfarin:  Last INR was 1.9 on 03/16/24 Next INR due 04/20/24 LOV was 03/01/24  Refill approved.

## 2024-04-04 ENCOUNTER — Other Ambulatory Visit: Payer: Self-pay | Admitting: Family Medicine

## 2024-04-04 ENCOUNTER — Telehealth: Payer: Self-pay | Admitting: Nurse Practitioner

## 2024-04-04 MED ORDER — METOPROLOL SUCCINATE ER 50 MG PO TB24: 75.0000 mg | ORAL_TABLET | Freq: Two times a day (BID) | ORAL | 1 refills | Status: DC

## 2024-04-04 NOTE — Telephone Encounter (Signed)
*  STAT* If patient is at the pharmacy, call can be transferred to refill team.   1. Which medications need to be refilled? (please list name of each medication and dose if known) metoprolol  succinate (TOPROL  XL) 50 MG 24 hr tablet    2. Would you like to learn more about the convenience, safety, & potential cost savings by using the [facility]?      3. Are you open to using the [facility] (Type [facility].  ).   4. Which pharmacy/location (including street and city if local pharmacy) is medication to be sent to? [facility] - [place], [place] - [address]    5. Do they need a 30 day or 90 day supply? 90 day Medication was recently increased and script wasn't sent to pharmacy.

## 2024-04-04 NOTE — Telephone Encounter (Signed)
 Filled

## 2024-04-07 ENCOUNTER — Other Ambulatory Visit: Payer: Self-pay

## 2024-04-07 MED ORDER — METOPROLOL SUCCINATE ER 50 MG PO TB24: 75.0000 mg | ORAL_TABLET | Freq: Two times a day (BID) | ORAL | 3 refills | Status: DC

## 2024-04-20 ENCOUNTER — Encounter

## 2024-05-05 ENCOUNTER — Ambulatory Visit: Admitting: Nurse Practitioner

## 2024-06-06 DIAGNOSIS — M4316 Spondylolisthesis, lumbar region: Secondary | ICD-10-CM | POA: Diagnosis not present

## 2024-06-06 DIAGNOSIS — M797 Fibromyalgia: Secondary | ICD-10-CM | POA: Diagnosis not present

## 2024-06-06 DIAGNOSIS — M2550 Pain in unspecified joint: Secondary | ICD-10-CM | POA: Diagnosis not present

## 2024-06-06 DIAGNOSIS — G894 Chronic pain syndrome: Secondary | ICD-10-CM | POA: Diagnosis not present

## 2024-06-06 DIAGNOSIS — M47816 Spondylosis without myelopathy or radiculopathy, lumbar region: Secondary | ICD-10-CM | POA: Diagnosis not present

## 2024-06-15 ENCOUNTER — Ambulatory Visit: Attending: Cardiovascular Disease | Admitting: *Deleted

## 2024-06-15 DIAGNOSIS — Z5181 Encounter for therapeutic drug level monitoring: Secondary | ICD-10-CM | POA: Diagnosis not present

## 2024-06-15 DIAGNOSIS — I480 Paroxysmal atrial fibrillation: Secondary | ICD-10-CM | POA: Diagnosis not present

## 2024-06-15 LAB — POCT INR: INR: 2.7 (ref 2.0–3.0)

## 2024-06-15 NOTE — Patient Instructions (Signed)
 No DOAC at this time.  On tegratol and lyrica  Continue warfarin 2 tablets daily except 1 tablet on Sundays, Tuesdays and Thursdays  Recheck in 6 wks

## 2024-07-01 ENCOUNTER — Other Ambulatory Visit: Payer: Self-pay | Admitting: Family Medicine

## 2024-07-27 ENCOUNTER — Ambulatory Visit: Attending: Cardiovascular Disease | Admitting: *Deleted

## 2024-07-27 DIAGNOSIS — I480 Paroxysmal atrial fibrillation: Secondary | ICD-10-CM | POA: Diagnosis not present

## 2024-07-27 DIAGNOSIS — Z5181 Encounter for therapeutic drug level monitoring: Secondary | ICD-10-CM | POA: Insufficient documentation

## 2024-07-27 LAB — POCT INR: INR: 4.1 — AB (ref 2.0–3.0)

## 2024-07-27 NOTE — Progress Notes (Signed)
 INR 4.1; Please see anticoagulation encounter

## 2024-07-27 NOTE — Patient Instructions (Signed)
 No DOAC at this time.  On tegratol and lyrica  Hold warfarin tonight then resume 2 tablets daily except 1 tablet on Sundays, Tuesdays and Thursdays  Recheck in 2 wks

## 2024-08-04 ENCOUNTER — Other Ambulatory Visit: Payer: Self-pay | Admitting: Family Medicine

## 2024-08-08 ENCOUNTER — Other Ambulatory Visit: Payer: Self-pay

## 2024-08-08 MED ORDER — PREGABALIN 150 MG PO CAPS: 150.0000 mg | ORAL_CAPSULE | Freq: Two times a day (BID) | ORAL | 1 refills | Status: AC

## 2024-08-09 ENCOUNTER — Encounter

## 2024-08-15 ENCOUNTER — Ambulatory Visit: Attending: Cardiovascular Disease | Admitting: *Deleted

## 2024-08-15 DIAGNOSIS — Z5181 Encounter for therapeutic drug level monitoring: Secondary | ICD-10-CM | POA: Diagnosis not present

## 2024-08-15 DIAGNOSIS — I480 Paroxysmal atrial fibrillation: Secondary | ICD-10-CM | POA: Insufficient documentation

## 2024-08-15 LAB — POCT INR: INR: 3.6 — AB (ref 2.0–3.0)

## 2024-08-15 NOTE — Progress Notes (Signed)
 INR 3.6  Please see anticoagulation encounter

## 2024-08-15 NOTE — Patient Instructions (Signed)
 No DOAC at this time.  On tegratol and lyrica  Hold warfarin tonight then decrease dose to 1 tablet daily except 2 tablets on Mondays, Wednesdays and Fridays  Recheck in 3 wks

## 2024-09-05 ENCOUNTER — Encounter

## 2024-09-05 DIAGNOSIS — M4316 Spondylolisthesis, lumbar region: Secondary | ICD-10-CM | POA: Diagnosis not present

## 2024-09-05 DIAGNOSIS — G894 Chronic pain syndrome: Secondary | ICD-10-CM | POA: Diagnosis not present

## 2024-09-05 DIAGNOSIS — Z79891 Long term (current) use of opiate analgesic: Secondary | ICD-10-CM | POA: Diagnosis not present

## 2024-09-05 DIAGNOSIS — M797 Fibromyalgia: Secondary | ICD-10-CM | POA: Diagnosis not present

## 2024-09-05 DIAGNOSIS — M47816 Spondylosis without myelopathy or radiculopathy, lumbar region: Secondary | ICD-10-CM | POA: Diagnosis not present

## 2024-09-05 DIAGNOSIS — M2550 Pain in unspecified joint: Secondary | ICD-10-CM | POA: Diagnosis not present

## 2024-09-06 ENCOUNTER — Ambulatory Visit: Attending: Cardiovascular Disease | Admitting: *Deleted

## 2024-09-06 DIAGNOSIS — Z5181 Encounter for therapeutic drug level monitoring: Secondary | ICD-10-CM | POA: Diagnosis not present

## 2024-09-06 DIAGNOSIS — I480 Paroxysmal atrial fibrillation: Secondary | ICD-10-CM | POA: Diagnosis not present

## 2024-09-06 LAB — POCT INR: INR: 1.5 — AB (ref 2.0–3.0)

## 2024-09-06 NOTE — Patient Instructions (Signed)
 No DOAC at this time.  On tegratol and lyrica  Take warfarin 2 1/2 tablets tonight and tomorrow night then resume 1 tablet daily except 2 tablets on Mondays, Wednesdays and Fridays  Recheck in 3 wks

## 2024-09-06 NOTE — Progress Notes (Signed)
 INR-1.5; Please see anticoagulation encounter

## 2024-09-14 ENCOUNTER — Other Ambulatory Visit: Payer: Self-pay | Admitting: Nurse Practitioner

## 2024-09-14 DIAGNOSIS — I4891 Unspecified atrial fibrillation: Secondary | ICD-10-CM

## 2024-09-19 ENCOUNTER — Telehealth: Payer: Self-pay | Admitting: Cardiovascular Disease

## 2024-09-19 DIAGNOSIS — I4891 Unspecified atrial fibrillation: Secondary | ICD-10-CM

## 2024-09-19 MED ORDER — WARFARIN SODIUM 5 MG PO TABS: ORAL_TABLET | ORAL | 1 refills | Status: DC

## 2024-09-19 NOTE — Telephone Encounter (Signed)
*  STAT* If patient is at the pharmacy, call can be transferred to refill team.   1. Which medications need to be refilled? (please list name of each medication and dose if known)   warfarin (COUMADIN ) 5 MG tablet   2. Would you like to learn more about the convenience, safety, & potential cost savings by using the [facility]?   3. Are you open to using the [facility] (Type [facility]. ).  4. Which pharmacy/location (including street and city if local pharmacy) is medication to be sent to?  [facility] - [place], [place] - [address]   5. Do they need a 30 day or 90 day supply?   30 day  Patient stated she has a week's worth of medication left.

## 2024-09-27 ENCOUNTER — Encounter

## 2024-10-04 ENCOUNTER — Ambulatory Visit: Attending: Cardiovascular Disease | Admitting: *Deleted

## 2024-10-04 DIAGNOSIS — I480 Paroxysmal atrial fibrillation: Secondary | ICD-10-CM | POA: Insufficient documentation

## 2024-10-04 DIAGNOSIS — Z5181 Encounter for therapeutic drug level monitoring: Secondary | ICD-10-CM | POA: Diagnosis not present

## 2024-10-04 LAB — POCT INR: INR: 1.9 — AB (ref 2.0–3.0)

## 2024-10-04 NOTE — Patient Instructions (Signed)
 No DOAC at this time.  On tegratol and lyrica  Increase warfarin to 2 tablets daily except 1 tablet on Sundays, Tuesdays and Thursdays  Recheck in 4 wks

## 2024-10-04 NOTE — Progress Notes (Signed)
 INR 1.9; Please see anticoagulation encounter

## 2024-10-05 ENCOUNTER — Other Ambulatory Visit: Payer: Self-pay | Admitting: Family Medicine

## 2024-10-28 ENCOUNTER — Other Ambulatory Visit: Payer: Self-pay

## 2024-10-28 ENCOUNTER — Inpatient Hospital Stay (HOSPITAL_COMMUNITY)
Admission: EM | Admit: 2024-10-28 | Discharge: 2024-10-31 | DRG: 291 | Disposition: A | Attending: Internal Medicine | Admitting: Internal Medicine

## 2024-10-28 ENCOUNTER — Emergency Department (HOSPITAL_COMMUNITY)

## 2024-10-28 DIAGNOSIS — M7989 Other specified soft tissue disorders: Secondary | ICD-10-CM | POA: Diagnosis not present

## 2024-10-28 DIAGNOSIS — Z8249 Family history of ischemic heart disease and other diseases of the circulatory system: Secondary | ICD-10-CM

## 2024-10-28 DIAGNOSIS — Z83438 Family history of other disorder of lipoprotein metabolism and other lipidemia: Secondary | ICD-10-CM

## 2024-10-28 DIAGNOSIS — I480 Paroxysmal atrial fibrillation: Secondary | ICD-10-CM | POA: Diagnosis not present

## 2024-10-28 DIAGNOSIS — G894 Chronic pain syndrome: Secondary | ICD-10-CM | POA: Diagnosis present

## 2024-10-28 DIAGNOSIS — Z8419 Family history of other disorders of kidney and ureter: Secondary | ICD-10-CM

## 2024-10-28 DIAGNOSIS — K219 Gastro-esophageal reflux disease without esophagitis: Secondary | ICD-10-CM | POA: Diagnosis present

## 2024-10-28 DIAGNOSIS — Z6841 Body Mass Index (BMI) 40.0 and over, adult: Secondary | ICD-10-CM

## 2024-10-28 DIAGNOSIS — R0789 Other chest pain: Secondary | ICD-10-CM | POA: Diagnosis not present

## 2024-10-28 DIAGNOSIS — Z0189 Encounter for other specified special examinations: Secondary | ICD-10-CM

## 2024-10-28 DIAGNOSIS — G500 Trigeminal neuralgia: Secondary | ICD-10-CM | POA: Diagnosis present

## 2024-10-28 DIAGNOSIS — G35D Multiple sclerosis, unspecified: Secondary | ICD-10-CM | POA: Diagnosis present

## 2024-10-28 DIAGNOSIS — Z85118 Personal history of other malignant neoplasm of bronchus and lung: Secondary | ICD-10-CM

## 2024-10-28 DIAGNOSIS — M810 Age-related osteoporosis without current pathological fracture: Secondary | ICD-10-CM | POA: Diagnosis present

## 2024-10-28 DIAGNOSIS — I4891 Unspecified atrial fibrillation: Secondary | ICD-10-CM

## 2024-10-28 DIAGNOSIS — I5031 Acute diastolic (congestive) heart failure: Secondary | ICD-10-CM | POA: Diagnosis not present

## 2024-10-28 DIAGNOSIS — I110 Hypertensive heart disease with heart failure: Principal | ICD-10-CM | POA: Diagnosis present

## 2024-10-28 DIAGNOSIS — I5033 Acute on chronic diastolic (congestive) heart failure: Secondary | ICD-10-CM | POA: Diagnosis present

## 2024-10-28 DIAGNOSIS — I10 Essential (primary) hypertension: Secondary | ICD-10-CM | POA: Diagnosis present

## 2024-10-28 DIAGNOSIS — Z79899 Other long term (current) drug therapy: Secondary | ICD-10-CM

## 2024-10-28 DIAGNOSIS — Z902 Acquired absence of lung [part of]: Secondary | ICD-10-CM

## 2024-10-28 DIAGNOSIS — E785 Hyperlipidemia, unspecified: Secondary | ICD-10-CM | POA: Diagnosis present

## 2024-10-28 DIAGNOSIS — K5900 Constipation, unspecified: Secondary | ICD-10-CM | POA: Diagnosis present

## 2024-10-28 DIAGNOSIS — F1120 Opioid dependence, uncomplicated: Secondary | ICD-10-CM | POA: Diagnosis present

## 2024-10-28 DIAGNOSIS — R0602 Shortness of breath: Secondary | ICD-10-CM | POA: Diagnosis present

## 2024-10-28 DIAGNOSIS — Z8041 Family history of malignant neoplasm of ovary: Secondary | ICD-10-CM

## 2024-10-28 DIAGNOSIS — E782 Mixed hyperlipidemia: Secondary | ICD-10-CM

## 2024-10-28 DIAGNOSIS — Z813 Family history of other psychoactive substance abuse and dependence: Secondary | ICD-10-CM

## 2024-10-28 DIAGNOSIS — I700 Atherosclerosis of aorta: Secondary | ICD-10-CM | POA: Diagnosis not present

## 2024-10-28 DIAGNOSIS — R079 Chest pain, unspecified: Principal | ICD-10-CM

## 2024-10-28 DIAGNOSIS — Z888 Allergy status to other drugs, medicaments and biological substances status: Secondary | ICD-10-CM

## 2024-10-28 DIAGNOSIS — Z820 Family history of epilepsy and other diseases of the nervous system: Secondary | ICD-10-CM

## 2024-10-28 DIAGNOSIS — Z803 Family history of malignant neoplasm of breast: Secondary | ICD-10-CM

## 2024-10-28 DIAGNOSIS — Z823 Family history of stroke: Secondary | ICD-10-CM

## 2024-10-28 DIAGNOSIS — Z87891 Personal history of nicotine dependence: Secondary | ICD-10-CM

## 2024-10-28 DIAGNOSIS — Z7901 Long term (current) use of anticoagulants: Secondary | ICD-10-CM

## 2024-10-28 DIAGNOSIS — I4819 Other persistent atrial fibrillation: Secondary | ICD-10-CM | POA: Diagnosis present

## 2024-10-28 DIAGNOSIS — Z85828 Personal history of other malignant neoplasm of skin: Secondary | ICD-10-CM

## 2024-10-28 LAB — BASIC METABOLIC PANEL WITH GFR
Anion gap: 9 (ref 5–15)
BUN: 16 mg/dL (ref 8–23)
CO2: 24 mmol/L (ref 22–32)
Calcium: 8.8 mg/dL — ABNORMAL LOW (ref 8.9–10.3)
Chloride: 108 mmol/L (ref 98–111)
Creatinine, Ser: 0.64 mg/dL (ref 0.44–1.00)
GFR, Estimated: >60 mL/min (ref >60)
Glucose, Bld: 93 mg/dL (ref 70–99)
Potassium: 4.1 mmol/L (ref 3.5–5.1)
Sodium: 141 mmol/L (ref 135–145)

## 2024-10-28 LAB — CBC
HCT: 38.9 % (ref 36.0–46.0)
Hemoglobin: 12.2 g/dL (ref 12.0–15.0)
MCH: 28.8 pg (ref 26.0–34.0)
MCHC: 31.4 g/dL (ref 30.0–36.0)
MCV: 91.7 fL (ref 80.0–100.0)
Platelets: 174 K/uL (ref 150–400)
RBC: 4.24 MIL/uL (ref 3.87–5.11)
RDW: 13.6 % (ref 11.5–15.5)
WBC: 6.3 K/uL (ref 4.0–10.5)
nRBC: 0 % (ref 0.0–0.2)

## 2024-10-28 LAB — MAGNESIUM: Magnesium: 2.1 mg/dL (ref 1.7–2.4)

## 2024-10-28 LAB — TROPONIN I (HIGH SENSITIVITY)
Troponin I (High Sensitivity): 6 ng/L (ref <18)
Troponin I (High Sensitivity): 7 ng/L (ref <18)

## 2024-10-28 LAB — BRAIN NATRIURETIC PEPTIDE: B Natriuretic Peptide: 375.4 pg/mL — ABNORMAL HIGH (ref 0.0–100.0)

## 2024-10-28 LAB — PROTIME-INR
INR: 1.5 — ABNORMAL HIGH (ref 0.8–1.2)
Prothrombin Time: 18.9 s — ABNORMAL HIGH (ref 11.4–15.2)

## 2024-10-28 MED ORDER — WARFARIN SODIUM 7.5 MG PO TABS
12.5000 mg | ORAL_TABLET | Freq: Once | ORAL | Status: AC
  Administered 2024-10-28: 12.5 mg via ORAL
  Filled 2024-10-28: qty 1

## 2024-10-28 MED ORDER — PREGABALIN 75 MG PO CAPS
150.0000 mg | ORAL_CAPSULE | Freq: Two times a day (BID) | ORAL | Status: DC
  Administered 2024-10-29 – 2024-10-30 (×2): 150 mg via ORAL
  Filled 2024-10-28 (×5): qty 2

## 2024-10-28 MED ORDER — LABETALOL HCL 5 MG/ML IV SOLN
5.0000 mg | INTRAVENOUS | Status: DC | PRN
  Administered 2024-10-28: 5 mg via INTRAVENOUS
  Filled 2024-10-28: qty 4

## 2024-10-28 MED ORDER — ACETAMINOPHEN 650 MG RE SUPP: 650.0000 mg | Freq: Four times a day (QID) | RECTAL | Status: DC | PRN

## 2024-10-28 MED ORDER — ACETAMINOPHEN 325 MG PO TABS
650.0000 mg | ORAL_TABLET | Freq: Four times a day (QID) | ORAL | Status: DC | PRN
  Administered 2024-10-28: 650 mg via ORAL
  Filled 2024-10-28: qty 2

## 2024-10-28 MED ORDER — MELATONIN 3 MG PO TABS: 3.0000 mg | ORAL_TABLET | Freq: Every evening | ORAL | Status: DC | PRN

## 2024-10-28 MED ORDER — METOPROLOL SUCCINATE ER 50 MG PO TB24
75.0000 mg | ORAL_TABLET | Freq: Two times a day (BID) | ORAL | Status: DC
  Administered 2024-10-28 – 2024-10-31 (×6): 75 mg via ORAL
  Filled 2024-10-28 (×5): qty 1
  Filled 2024-10-28: qty 3

## 2024-10-28 MED ORDER — ONDANSETRON HCL 4 MG/2ML IJ SOLN: 4.0000 mg | Freq: Four times a day (QID) | INTRAMUSCULAR | Status: DC | PRN

## 2024-10-28 MED ORDER — FUROSEMIDE 10 MG/ML IJ SOLN
20.0000 mg | Freq: Two times a day (BID) | INTRAMUSCULAR | Status: DC
  Administered 2024-10-28: 20 mg via INTRAVENOUS
  Filled 2024-10-28 (×2): qty 2

## 2024-10-28 MED ORDER — POTASSIUM CHLORIDE CRYS ER 20 MEQ PO TBCR
20.0000 meq | EXTENDED_RELEASE_TABLET | Freq: Once | ORAL | Status: AC
  Administered 2024-10-28: 20 meq via ORAL
  Filled 2024-10-28: qty 1

## 2024-10-28 MED ORDER — HYDROCODONE-ACETAMINOPHEN 5-325 MG PO TABS: 1.0000 | ORAL_TABLET | Freq: Once | ORAL | Status: DC

## 2024-10-28 MED ORDER — WARFARIN - PHARMACIST DOSING INPATIENT: Freq: Every day | Status: DC

## 2024-10-28 MED ORDER — ENOXAPARIN SODIUM 120 MG/0.8ML IJ SOSY
110.0000 mg | PREFILLED_SYRINGE | Freq: Two times a day (BID) | INTRAMUSCULAR | Status: DC
  Administered 2024-10-29 – 2024-10-30 (×3): 110 mg via SUBCUTANEOUS
  Filled 2024-10-28 (×6): qty 0.74

## 2024-10-28 MED ORDER — HYDROCODONE-ACETAMINOPHEN 10-325 MG PO TABS
1.0000 | ORAL_TABLET | Freq: Once | ORAL | Status: AC
  Administered 2024-10-28: 1 via ORAL
  Filled 2024-10-28: qty 1

## 2024-10-28 MED ORDER — CARBAMAZEPINE 200 MG PO TABS
200.0000 mg | ORAL_TABLET | Freq: Once | ORAL | Status: AC
  Administered 2024-10-28: 200 mg via ORAL
  Filled 2024-10-28 (×2): qty 1

## 2024-10-28 MED ORDER — HYDROCODONE-ACETAMINOPHEN 10-325 MG PO TABS
1.0000 | ORAL_TABLET | Freq: Four times a day (QID) | ORAL | Status: DC | PRN
  Administered 2024-10-29 – 2024-10-31 (×9): 1 via ORAL
  Filled 2024-10-28 (×9): qty 1
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
## 2024-10-28 NOTE — ED Triage Notes (Signed)
 Patient here with complaints of bilateral leg swelling since last night, she is now reporting that she is having shob and chest pressure.    Denies history of  chest pressure.

## 2024-10-28 NOTE — ED Notes (Signed)
 Patient wanting to have someone to bring her home medications to take. RN advised against this.
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
## 2024-10-28 NOTE — ED Provider Notes (Signed)
 [place] EMERGENCY DEPARTMENT AT [facility] Provider Note   CSN: [id number] Arrival date & time: 10/28/24  1321     Patient presents with: Leg Swelling, Shortness of Breath, and Chest Pain   [name] is a 73 y.o. female.    Shortness of Breath Associated symptoms: chest pain   Chest Pain Associated symptoms: shortness of breath    Past medical history of GERD, osteoporosis, anxiety, multiple sclerosis not on therapy, chronic pain on chronic opioids, coronary artery disease, history of lung cancer status post surgery, A-fib on Coumadin , fibromyalgia.  Accompanied by grandson.  Patient presents with new bilateral leg swelling.  She says that she has never had swelling in her legs before.  She first noticed this last night when she lie down for bed.  In association with noticing the bilateral leg swelling she also noticed that she was having chest pain that she describes as chest pressure.  She is not able to reproduce the pain with palpation of her chest.  She says that with the chest pressure it is difficult to breathe and she feels like she cannot get a deep breath.  She has never had symptoms like this before.  She denies sick contacts or URI symptoms.  She says that she has been compliant with her metoprolol  and warfarin.  She has not had her pain medicines today and is experiencing a lot of pain and she has not eaten today so she has a lot of upper abdominal pain that she says is normal for her.  Denies changes in bowel movements, last normal bowel movement was this morning.  Denies changes in urination such as pain or itching.  Denies dizziness or lightheadedness.  She does have a headache but she says this happens when she does not take her pain medicine.  She says she does not smoke cigarettes and does not drink alcohol.    Prior to Admission medications   Medication Sig Start Date End Date Taking? Authorizing Provider  Blood Pressure Monitor DEVI 1 each by  Does not apply route daily. 06/23/23   Miriam Norris, NP  calcium  citrate (CALCITRATE - DOSED IN MG ELEMENTAL CALCIUM ) 950 (200 Ca) MG tablet Take by mouth daily.    [provider]  carbamazepine  (TEGRETOL  XR) 200 MG 12 hr tablet Take 200 mg by mouth 2 (two) times daily.    [provider]  carbamazepine  (TEGRETOL ) 200 MG tablet TAKE 1 TABLET BY MOUTH IN THE MORNING AND 1 AT BEDTIME 10/05/24   Cook, Jayce G, DO  Cholecalciferol 125 MCG (5000 UT) TABS Take by mouth. 04/18/13   [provider]  diphenhydrAMINE (BENADRYL) 25 MG tablet Take 25 mg by mouth every 6 (six) hours as needed. Patient takes 1 tablet at night.    [provider]  HYDROcodone -acetaminophen  (NORCO) 10-325 MG tablet Take 1 tablet by mouth every 6 (six) hours as needed. Max 4 per day 11/12/17   Mauro Elveria BROCKS, NP  Lecithin 1200 MG CAPS Take by mouth daily.    [provider]  Magnesium 200 MG TABS Take by mouth daily.    [provider]  metoprolol  succinate (TOPROL  XL) 50 MG 24 hr tablet Take 1.5 tablets (75 mg total) by mouth in the morning and at bedtime. Take with or immediately following a meal. 04/07/24   Miriam Norris, NP  pantoprazole  (PROTONIX ) 40 MG tablet Take 1 tablet (40 mg total) by mouth daily. 11/04/22   Cook, Jayce G, DO  pregabalin  (LYRICA ) 150 MG capsule Take 1 capsule (150 mg total) by mouth 2 (two) times daily. 08/08/24   Cook, Jayce G, DO  rosuvastatin  (CRESTOR ) 10 MG tablet Take 1 tablet by mouth once daily 01/04/24   Miriam Norris, NP  warfarin (COUMADIN ) 5 MG tablet TAKE 1 TO 2 TABLETS BY MOUTH ONCE DAILY OR AS DIRECTED BY COUMADIN   CLINIC. 09/19/24   Delford Maude BROCKS, MD    Allergies: Other    Review of Systems  Respiratory:  Positive for shortness of breath.   Cardiovascular:  Positive for chest pain.    Updated Vital Signs BP (!) 171/112   Pulse 96   Temp 98.3 F (36.8 C) (Oral)   Resp 18   Ht 5' 6 (1.676 m)   Wt 108.9 kg   SpO2 99%    BMI 38.74 kg/m   Physical Exam Vitals reviewed.  Constitutional:      Appearance: She is obese. She is not ill-appearing or diaphoretic.  Eyes:     Extraocular Movements: Extraocular movements intact.  Neck:     Vascular: No JVD.  Cardiovascular:     Rate and Rhythm: Normal rate. Rhythm irregular.  Pulmonary:     Effort: Pulmonary effort is normal.     Breath sounds: Normal breath sounds. No rales.  Chest:     Chest wall: No tenderness.  Abdominal:     Tenderness: There is abdominal tenderness.     Comments: Diffuse tenderness to palpation, protuberant abdomen  Musculoskeletal:     Right lower leg: Tenderness present. Edema present.     Left lower leg: Tenderness present. Edema present.     Comments: Bilateral 2+ pitting edema up to her knees  Skin:    General: Skin is warm.     Comments: Multiple small dilated venous structures on dorsum of foot and medial and lateral ankles bilaterally  Neurological:     Mental Status: She is alert and oriented to person, place, and time.     (all labs ordered are listed, but only abnormal results are displayed) Labs Reviewed  BASIC METABOLIC PANEL WITH GFR - Abnormal; Notable for the following components:      Result Value   Calcium  8.8 (*)    All other components within normal limits  BRAIN NATRIURETIC PEPTIDE - Abnormal; Notable for the following components:   B Natriuretic Peptide 375.4 (*)    All other components within normal limits  CBC  PROTIME-INR  TROPONIN I (HIGH SENSITIVITY)  TROPONIN I (HIGH SENSITIVITY)    EKG: EKG Interpretation Date/Time:  Friday October 28 2024 13:41:44 EST Ventricular Rate:  85 PR Interval:    QRS Duration:  78 QT Interval:  332 QTC Calculation: 395 R Axis:   -4  Text Interpretation: Atrial fibrillation Nonspecific ST abnormality Abnormal ECG When compared with ECG of 01-Mar-2024 13:19, PREVIOUS ECG IS PRESENT when compared to prior, similar appearance No sTEMI Confirmed by Ginger Barefoot  [id number]) on 10/28/2024 5:54:07 PM  Radiology: [facility] Chest 2 View Result Date: 10/28/2024 EXAM: 2 VIEW(S) XRAY OF THE CHEST 10/28/2024 02:03:00 PM COMPARISON: None available. CLINICAL HISTORY: SOB FINDINGS: LUNGS AND PLEURA: No focal pulmonary opacity. No pleural effusion. No pneumothorax. HEART AND MEDIASTINUM: Aortic atherosclerosis is present. The cardiac silhouette is unremarkable. BONES AND SOFT TISSUES: No acute osseous abnormality. IMPRESSION: 1. No acute cardiopulmonary process. 2. Aortic atherosclerosis Electronically signed by: Norleen Boxer MD 10/28/2024 02:30 PM EST RP Workstation: [id number]     Procedures   Medications Ordered in  the ED  acetaminophen  (TYLENOL ) tablet 650 mg (has no administration in time range)    Or  acetaminophen  (TYLENOL ) suppository 650 mg (has no administration in time range)  carbamazepine  (TEGRETOL ) tablet 200 mg (200 mg Oral Given 10/28/24 1954)  HYDROcodone -acetaminophen  (NORCO) 10-325 MG per tablet 1 tablet (1 tablet Oral Given 10/28/24 1951)                                    Medical Decision Making Amount and/or Complexity of Data Reviewed Labs: ordered. Radiology: ordered.   This patient presents to the ED for concern of bilateral lower extremity swelling, this involves an extensive number of treatment options, and is a complaint that carries with it a high risk of complications and morbidity.  The differential diagnosis includes heart failure exacerbation, venous stasis, atrial fibrillation with RVR, ACS, pulmonary embolism, DVT   Co morbidities / Chronic conditions that complicate the patient evaluation  GERD, MS, CAD, history of lung cancer, aortic atherosclerosis, A-fib, hyperlipidemia   Additional history obtained:  Additional history obtained from EMR External records from outside source obtained and reviewed including last neurology office visit note, warfarin monitoring, last office visit with pain medicine   Lab Tests:  I  Ordered, and personally interpreted labs.  The pertinent results include: Calcium  8.8, BNP 375.4   Imaging Studies ordered:  I ordered imaging studies including chest x-ray I independently visualized and interpreted imaging which showed normal heart size and no pleural effusion, no acute cardiopulmonary abnormality I agree with the radiologist interpretation   Cardiac Monitoring: / EKG:  The patient was maintained on a cardiac monitor.  I personally viewed and interpreted the cardiac monitored which showed an underlying rhythm of: Atrial fibrillation   Problem List / ED Course / Critical interventions / Medication management  New onset bilateral pitting edema with concomitant chest pressure and shortness of breath, unable to comfortably lie flat in hospital bed.  Elevated blood pressures and tachycardia. I ordered medication including home medication hydrocodone /acetaminophen  10/325 and carbamazepine  200 Discussed with patient and family our recommendation for admission given her new onset symptoms.  Patient and family agreed to admission. I have reviewed the patients home medicines and have made adjustments as needed   Consultations Obtained:  I requested consultation with the hospitalist,  and discussed lab and imaging findings as well as pertinent plan - they recommend: Admission   Social Determinants of Health:  Family support   Test / Admission - Considered:  Given new onset bilateral pitting edema accompanied by central chest pressure and shortness of breath that started within the last 24 hours and patient has no history of similar symptoms, I think this patient would benefit from inpatient admission for cardiac workup and monitoring and management.  Assigned to patient, ordered hospital admission, spoke with admitting hospitalist and they agree.  They will admit the patient for further workup and management.      Final diagnoses:  Chest pain, unspecified type   Shortness of breath  Leg swelling    ED Discharge Orders     None          Charmayne Holmes, DO 10/28/24 2028    Tegeler, Lonni PARAS, MD 10/28/24 [phone]

## 2024-10-29 ENCOUNTER — Observation Stay (HOSPITAL_COMMUNITY)

## 2024-10-29 ENCOUNTER — Encounter (HOSPITAL_COMMUNITY): Payer: Self-pay | Admitting: Internal Medicine

## 2024-10-29 DIAGNOSIS — I5031 Acute diastolic (congestive) heart failure: Secondary | ICD-10-CM

## 2024-10-29 DIAGNOSIS — I10 Essential (primary) hypertension: Secondary | ICD-10-CM | POA: Diagnosis present

## 2024-10-29 DIAGNOSIS — R0602 Shortness of breath: Secondary | ICD-10-CM | POA: Diagnosis present

## 2024-10-29 LAB — COMPREHENSIVE METABOLIC PANEL WITH GFR
ALT: 13 U/L (ref 0–44)
AST: 18 U/L (ref 15–41)
Albumin: 3.7 g/dL (ref 3.5–5.0)
Alkaline Phosphatase: 51 U/L (ref 38–126)
Anion gap: 10 (ref 5–15)
BUN: 13 mg/dL (ref 8–23)
CO2: 27 mmol/L (ref 22–32)
Calcium: 8.8 mg/dL — ABNORMAL LOW (ref 8.9–10.3)
Chloride: 108 mmol/L (ref 98–111)
Creatinine, Ser: 0.72 mg/dL (ref 0.44–1.00)
GFR, Estimated: >60 mL/min (ref >60)
Glucose, Bld: 130 mg/dL — ABNORMAL HIGH (ref 70–99)
Potassium: 3.3 mmol/L — ABNORMAL LOW (ref 3.5–5.1)
Sodium: 145 mmol/L (ref 135–145)
Total Bilirubin: 0.4 mg/dL (ref 0.0–1.2)
Total Protein: 6.8 g/dL (ref 6.5–8.1)

## 2024-10-29 LAB — MAGNESIUM: Magnesium: 1.8 mg/dL (ref 1.7–2.4)

## 2024-10-29 LAB — CBC WITH DIFFERENTIAL/PLATELET
Abs Immature Granulocytes: 0.03 K/uL (ref 0.00–0.07)
Basophils Absolute: 0 K/uL (ref 0.0–0.1)
Basophils Relative: 0 %
Eosinophils Absolute: 0 K/uL (ref 0.0–0.5)
Eosinophils Relative: 0 %
HCT: 38 % (ref 36.0–46.0)
Hemoglobin: 12.1 g/dL (ref 12.0–15.0)
Immature Granulocytes: 0 %
Lymphocytes Relative: 29 %
Lymphs Abs: 2.1 K/uL (ref 0.7–4.0)
MCH: 28.7 pg (ref 26.0–34.0)
MCHC: 31.8 g/dL (ref 30.0–36.0)
MCV: 90 fL (ref 80.0–100.0)
Monocytes Absolute: 0.6 K/uL (ref 0.1–1.0)
Monocytes Relative: 9 %
Neutro Abs: 4.4 K/uL (ref 1.7–7.7)
Neutrophils Relative %: 62 %
Platelets: 150 K/uL (ref 150–400)
RBC: 4.22 MIL/uL (ref 3.87–5.11)
RDW: 13.7 % (ref 11.5–15.5)
WBC: 7.1 K/uL (ref 4.0–10.5)
nRBC: 0 % (ref 0.0–0.2)

## 2024-10-29 LAB — PROTIME-INR
INR: 1.5 — ABNORMAL HIGH (ref 0.8–1.2)
Prothrombin Time: 18.9 s — ABNORMAL HIGH (ref 11.4–15.2)

## 2024-10-29 LAB — PHOSPHORUS: Phosphorus: 3.8 mg/dL (ref 2.5–4.6)

## 2024-10-29 LAB — ECHOCARDIOGRAM COMPLETE
Est EF: >75
Height: 66 in
S' Lateral: 2.6 cm
Weight: 4006.4 [oz_av]

## 2024-10-29 LAB — BRAIN NATRIURETIC PEPTIDE: B Natriuretic Peptide: 299.7 pg/mL — ABNORMAL HIGH (ref 0.0–100.0)

## 2024-10-29 LAB — D-DIMER, QUANTITATIVE: D-Dimer, Quant: 0.29 ug{FEU}/mL (ref 0.00–0.50)

## 2024-10-29 MED ORDER — POTASSIUM CHLORIDE CRYS ER 20 MEQ PO TBCR
40.0000 meq | EXTENDED_RELEASE_TABLET | Freq: Two times a day (BID) | ORAL | Status: AC
  Administered 2024-10-29 (×2): 40 meq via ORAL
  Filled 2024-10-29 (×2): qty 2

## 2024-10-29 MED ORDER — CARBAMAZEPINE 200 MG PO TABS
200.0000 mg | ORAL_TABLET | Freq: Two times a day (BID) | ORAL | Status: DC
  Administered 2024-10-29 – 2024-10-31 (×5): 200 mg via ORAL
  Filled 2024-10-29 (×6): qty 1

## 2024-10-29 MED ORDER — SPIRONOLACTONE 25 MG PO TABS
25.0000 mg | ORAL_TABLET | Freq: Every day | ORAL | Status: DC
  Administered 2024-10-29 – 2024-10-31 (×3): 25 mg via ORAL
  Filled 2024-10-29 (×3): qty 1

## 2024-10-29 MED ORDER — FUROSEMIDE 10 MG/ML IJ SOLN
40.0000 mg | Freq: Two times a day (BID) | INTRAMUSCULAR | Status: DC
  Administered 2024-10-29 – 2024-10-30 (×4): 40 mg via INTRAVENOUS
  Filled 2024-10-29 (×4): qty 4

## 2024-10-29 MED ORDER — WARFARIN SODIUM 5 MG PO TABS
10.0000 mg | ORAL_TABLET | Freq: Once | ORAL | Status: AC
  Administered 2024-10-29: 10 mg via ORAL
  Filled 2024-10-29: qty 2

## 2024-10-29 MED ORDER — ROSUVASTATIN CALCIUM 5 MG PO TABS
10.0000 mg | ORAL_TABLET | Freq: Every day | ORAL | Status: DC
  Administered 2024-10-29 – 2024-10-31 (×3): 10 mg via ORAL
  Filled 2024-10-29 (×3): qty 2
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
## 2024-10-29 NOTE — Progress Notes (Addendum)
 PROGRESS NOTE    GENELLE ECONOMOU  FMW:[id number] DOB: 09-13-1951 DOA: 10/28/2024 PCP: Bluford Jacqulyn MATSU, DO  Chronically ill 73/F with history of multiple sclerosis, PAF on warfarin, chronic pain syndrome, narcotic dependence, presented to the ED with shortness of breath, chest tightness, orthopnea and leg swelling . - In the ED she was noted to be in A-fib, labs noted sodium 141, creatinine 0.6, BNP 375, troponin 6, 7, D-dimer 0.2, EKG noted rate controlled A-fib, chest x-ray without acute findings  Subjective: -Feels a little better, still with chest tightness and orthopnea  Assessment and Plan:  Acute CHF - Echo pending EF unknown - Remains volume overloaded today, increase Lasix to 40 mg twice daily - Continue metoprolol , add Aldactone - Appears to be a poor candidate for SGLT2i  Persistent atrial fibrillation - Rate controlled, continue Toprol , INR subtherapeutic on warfarin per pharmacy  Multiple sclerosis Chronic pain/narcotic dependence -Home regimen of Lyrica , Norco and Tegretol  resumed - Increase activity, PT OT eval  Dyslipidemia Resume rosuvastatin    DVT prophylaxis: Warfarin Code Status: Full code Family Communication: None present Disposition Plan: Home pending above workup  Consultants:    Procedures:   Antimicrobials:    Objective: Vitals:   10/28/24 2200 10/28/24 2252 10/29/24 0036 10/29/24 0602  BP:  (!) 184/125 (!) 161/95 (!) 143/63  Pulse:  96 95 88  Resp:  18 15 (!) 23  Temp:  (!) 97.4 F (36.3 C) 97.7 F (36.5 C) 98.5 F (36.9 C)  TempSrc:  Oral Oral Oral  SpO2:  96%    Weight: 113.6 kg     Height: 5' 6 (1.676 m)       Intake/Output Summary (Last 24 hours) at 10/29/2024 1030 Last data filed at 10/29/2024 0036 Gross per 24 hour  Intake 480 ml  Output 1200 ml  Net -720 ml   Filed Weights   10/28/24 1339 10/28/24 2200  Weight: 108.9 kg 113.6 kg    Examination:  General exam: Appears calm and comfortable AO x 3 HEENT: Positive  JVD Respiratory system: Clear to auscultation Cardiovascular system: S1 & S2 heard, irregular Abd: nondistended, soft and nontender.Normal bowel sounds heard. Central nervous system: Alert and oriented. No focal neurological deficits. Extremities: 1-2+ edema Skin: No rashes Psychiatry:  Mood & affect appropriate.     Data Reviewed:   CBC: Recent Labs  Lab 10/28/24 1343 10/29/24 0252  WBC 6.3 7.1  NEUTROABS  --  4.4  HGB 12.2 12.1  HCT 38.9 38.0  MCV 91.7 90.0  PLT 174 150   Basic Metabolic Panel: Recent Labs  Lab 10/28/24 1343 10/28/24 2144 10/29/24 0252  NA 141  --  145  K 4.1  --  3.3*  CL 108  --  108  CO2 24  --  27  GLUCOSE 93  --  130*  BUN 16  --  13  CREATININE 0.64  --  0.72  CALCIUM  8.8*  --  8.8*  MG  --  2.1 1.8  PHOS  --   --  3.8   GFR: Estimated Creatinine Clearance: 78.9 mL/min (by C-G formula based on SCr of 0.72 mg/dL). Liver Function Tests: Recent Labs  Lab 10/29/24 0252  AST 18  ALT 13  ALKPHOS 51  BILITOT 0.4  PROT 6.8  ALBUMIN 3.7   No results for input(s): LIPASE, AMYLASE in the last 168 hours. No results for input(s): AMMONIA in the last 168 hours. Coagulation Profile: Recent Labs  Lab 10/28/24 2144 10/29/24 0000  INR 1.5* 1.5*   Cardiac Enzymes: No results for input(s): CKTOTAL, CKMB, CKMBINDEX, TROPONINI in the last 168 hours. BNP (last 3 results) No results for input(s): PROBNP in the last 8760 hours. HbA1C: No results for input(s): HGBA1C in the last 72 hours. CBG: No results for input(s): GLUCAP in the last 168 hours. Lipid Profile: No results for input(s): CHOL, HDL, LDLCALC, TRIG, CHOLHDL, LDLDIRECT in the last 72 hours. Thyroid  Function Tests: No results for input(s): TSH, T4TOTAL, FREET4, T3FREE, THYROIDAB in the last 72 hours. Anemia Panel: No results for input(s): VITAMINB12, FOLATE, FERRITIN, TIBC, IRON, RETICCTPCT in the last 72 hours. Urine  analysis:    Component Value Date/Time   COLORURINE YELLOW 11/04/2007 1149   APPEARANCEUR CLEAR 11/04/2007 1149   LABSPEC 1.017 11/04/2007 1149   PHURINE 6.0 11/04/2007 1149   GLUCOSEU NEGATIVE 11/04/2007 1149   HGBUR NEGATIVE 11/04/2007 1149   BILIRUBINUR NEGATIVE 11/04/2007 1149   KETONESUR NEGATIVE 11/04/2007 1149   PROTEINUR NEGATIVE 11/04/2007 1149   UROBILINOGEN 0.2 11/04/2007 1149   NITRITE NEGATIVE 11/04/2007 1149   LEUKOCYTESUR SMALL (A) 11/04/2007 1149   Sepsis Labs: @LABRCNTIP (procalcitonin:4,lacticidven:4)  )No results found for this or any previous visit (from the past 240 hours).   Radiology Studies: DG Chest 2 View Result Date: 10/28/2024 EXAM: 2 VIEW(S) XRAY OF THE CHEST 10/28/2024 02:03:00 PM COMPARISON: None available. CLINICAL HISTORY: SOB FINDINGS: LUNGS AND PLEURA: No focal pulmonary opacity. No pleural effusion. No pneumothorax. HEART AND MEDIASTINUM: Aortic atherosclerosis is present. The cardiac silhouette is unremarkable. BONES AND SOFT TISSUES: No acute osseous abnormality. IMPRESSION: 1. No acute cardiopulmonary process. 2. Aortic atherosclerosis Electronically signed by: Norleen Boxer MD 10/28/2024 02:30 PM EST RP Workstation: [id number]     Scheduled Meds:  carbamazepine   200 mg Oral BID   enoxaparin (LOVENOX) injection  110 mg Subcutaneous Q12H   furosemide  40 mg Intravenous BID   metoprolol  succinate  75 mg Oral BID   potassium chloride  40 mEq Oral BID   pregabalin   150 mg Oral BID   rosuvastatin   10 mg Oral Daily   warfarin  10 mg Oral ONCE-1600   Warfarin - Pharmacist Dosing Inpatient   Does not apply q1600   Continuous Infusions:   LOS: 0 days    Time spent:    Sigurd Pac, MD [facility]   10/29/2024, 10:30 AM

## 2024-10-29 NOTE — Plan of Care (Signed)
  Problem: Education: Goal: Knowledge of General Education information will improve Description: Including pain rating scale, medication(s)/side effects and non-pharmacologic comfort measures Outcome: Progressing   Problem: Clinical Measurements: Goal: Ability to maintain clinical measurements within normal limits will improve Outcome: Progressing   Problem: Clinical Measurements: Goal: Will remain free from infection Outcome: Progressing   Problem: Coping: Goal: Level of anxiety will decrease Outcome: Progressing   Problem: Pain Managment: Goal: General experience of comfort will improve and/or be controlled Outcome: Progressing   Problem: Safety: Goal: Ability to remain free from injury will improve Outcome: Progressing   Problem: Skin Integrity: Goal: Risk for impaired skin integrity will decrease Outcome: Progressing

## 2024-10-29 NOTE — Progress Notes (Signed)
  Echocardiogram 2D Echocardiogram has been performed.  LAMON MAXWELL 10/29/2024, 8:57 AM

## 2024-10-30 LAB — BASIC METABOLIC PANEL WITH GFR
Anion gap: 9 (ref 5–15)
BUN: 17 mg/dL (ref 8–23)
CO2: 28 mmol/L (ref 22–32)
Calcium: 8.8 mg/dL — ABNORMAL LOW (ref 8.9–10.3)
Chloride: 104 mmol/L (ref 98–111)
Creatinine, Ser: 0.87 mg/dL (ref 0.44–1.00)
GFR, Estimated: >60 mL/min (ref >60)
Glucose, Bld: 107 mg/dL — ABNORMAL HIGH (ref 70–99)
Potassium: 3.6 mmol/L (ref 3.5–5.1)
Sodium: 141 mmol/L (ref 135–145)

## 2024-10-30 LAB — PROTIME-INR
INR: 2.3 — ABNORMAL HIGH (ref 0.8–1.2)
Prothrombin Time: 26 s — ABNORMAL HIGH (ref 11.4–15.2)

## 2024-10-30 MED ORDER — SENNOSIDES-DOCUSATE SODIUM 8.6-50 MG PO TABS
2.0000 | ORAL_TABLET | Freq: Two times a day (BID) | ORAL | Status: DC
  Administered 2024-10-30: 2 via ORAL
  Filled 2024-10-30 (×3): qty 2

## 2024-10-30 MED ORDER — WARFARIN SODIUM 7.5 MG PO TABS
7.5000 mg | ORAL_TABLET | Freq: Once | ORAL | Status: AC
  Administered 2024-10-30: 7.5 mg via ORAL
  Filled 2024-10-30: qty 1

## 2024-10-30 NOTE — Plan of Care (Signed)
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
## 2024-10-30 NOTE — Progress Notes (Signed)
 [facility] Progress Note Patient: [name] FMW:[id number] DOB: 25-Oct-1951  DOA: 10/28/2024 DOS: the patient was seen and examined on 10/30/2024  Brief Hospital Course: Patient with PMH of MS, PAF on warfarin, trigeminal neuralgia on carbamazepine , chronic pain, obesity presents to the hospital with complaints of shortness of breath and leg swelling.  Assessment and Plan: Acute on chronic diastolic CHF. Patient is with leg swelling, shortness of breath orthopnea and PND. Echocardiogram shows EF more than 75% with hyperdynamic function. Unremarkable valvular disease. No wall motion profundity. Started on IV Lasix.  Responding well. Will provide 1 more dose of IV Lasix and transition to oral likely as needed upon discharge.  Hyperdynamic LV. Patient is on Toprol -XL already 75 mg twice daily which I will continue. Close follow-up with cardiology recommended outpatient.  Paroxysmal A-fib. Patient is not in RVR although heart rate is within the 100 210 range at rest. There is moderate vertebral to worsening of her diastolic dysfunction. May need to increase the dose of the metoprolol  if heart rate remains elevated. On warfarin since she is on Tegretol  for trigeminal neuralgia due to interaction with DOAC's.  Trigeminal neuralgia. Chronic pain syndrome. On tramadol, Lyrica , Norco.  Continue.  Nausea. Patient reports that she had some nausea with Lasix in the past and even with last dose on 11/29. Currently no nausea. Likely related to constipation. Will treat constipation with Senokot.  HLD. On statin. Continue the same.  Morbid obesity. Body mass index is 39.43 kg/m.  Placing the patient at a high risk for poor outcome. Outpatient sleep study recommended.  History of multiple sclerosis. Continue PT OT. Outpatient follow-up.  Subjective: Feeling better.  Tells me that she is ready to go home.  No nausea no vomiting.  No fever no chills.  No chest pain or  shortness of breath.  Swelling in the leg improving.  Physical Exam: Bilateral lower extremity edema still present but improving per patient. No calf tenderness. S1-S2 present.  Aortic systolic murmur. Basilar crackles. Bowel sound present No focal deficit.  Data Reviewed: I have Reviewed nursing notes, Vitals, and Lab results. Since last encounter, pertinent lab results CBC and BMP   . I have ordered test including CBC and BMP  .  Disposition: Status is: Inpatient Remains inpatient appropriate because: Monitor for improvement in volume status  SCDs Start: 10/28/24 2027   Family Communication: Family at bedside Level of care: Progressive   Vitals:   10/30/24 0655 10/30/24 0905 10/30/24 1151 10/30/24 1624  BP:  (!) 152/86 (!) 142/96 (!) 154/100  Pulse:  77 99 90  Resp:  18 18 20   Temp:  97.8 F (36.6 C) 98 F (36.7 C) 97.8 F (36.6 C)  TempSrc:  Oral Oral Oral  SpO2:  92% 98% 93%  Weight: 110.8 kg     Height:         Author: Yetta Blanch, MD 10/30/2024 5:25 PM  Please look on [email or web address] to find out who is on call.

## 2024-10-30 NOTE — Progress Notes (Signed)
 Critical value for Calcium  of 5.7 reported to Dr. Noralee. MD states he will enter any needed orders.

## 2024-10-30 NOTE — Plan of Care (Signed)

## 2024-10-31 DIAGNOSIS — I5031 Acute diastolic (congestive) heart failure: Secondary | ICD-10-CM | POA: Diagnosis not present

## 2024-10-31 LAB — BASIC METABOLIC PANEL WITH GFR
Anion gap: 11 (ref 5–15)
BUN: 23 mg/dL (ref 8–23)
CO2: 29 mmol/L (ref 22–32)
Calcium: 8.6 mg/dL — ABNORMAL LOW (ref 8.9–10.3)
Chloride: 102 mmol/L (ref 98–111)
Creatinine, Ser: 0.92 mg/dL (ref 0.44–1.00)
GFR, Estimated: >60 mL/min (ref >60)
Glucose, Bld: 99 mg/dL (ref 70–99)
Potassium: 3.4 mmol/L — ABNORMAL LOW (ref 3.5–5.1)
Sodium: 142 mmol/L (ref 135–145)

## 2024-10-31 LAB — PROTIME-INR
INR: 2.5 — ABNORMAL HIGH (ref 0.8–1.2)
Prothrombin Time: 28 s — ABNORMAL HIGH (ref 11.4–15.2)

## 2024-10-31 MED ORDER — WARFARIN SODIUM 7.5 MG PO TABS
7.5000 mg | ORAL_TABLET | Freq: Once | ORAL | Status: AC
  Administered 2024-10-31: 7.5 mg via ORAL
  Filled 2024-10-31: qty 1

## 2024-10-31 MED ORDER — FUROSEMIDE 40 MG PO TABS: 40.0000 mg | ORAL_TABLET | Freq: Every day | ORAL | 0 refills | Status: AC | PRN

## 2024-10-31 MED ORDER — METOPROLOL SUCCINATE ER 50 MG PO TB24: 100.0000 mg | ORAL_TABLET | Freq: Two times a day (BID) | ORAL | 0 refills | Status: AC

## 2024-10-31 MED ORDER — WARFARIN SODIUM 5 MG PO TABS: ORAL_TABLET | ORAL | 1 refills | Status: AC

## 2024-10-31 MED ORDER — METOPROLOL SUCCINATE ER 100 MG PO TB24: 100.0000 mg | ORAL_TABLET | Freq: Two times a day (BID) | ORAL | Status: DC

## 2024-10-31 MED ORDER — DULCOLAX 5 MG PO TBEC: 5.0000 mg | DELAYED_RELEASE_TABLET | Freq: Every day | ORAL | 0 refills | Status: AC | PRN

## 2024-10-31 MED ORDER — WARFARIN SODIUM 7.5 MG PO TABS: 7.5000 mg | ORAL_TABLET | Freq: Once | ORAL | Status: DC

## 2024-10-31 MED ORDER — DOCUSATE SODIUM 100 MG PO CAPS: 100.0000 mg | ORAL_CAPSULE | Freq: Two times a day (BID) | ORAL | 0 refills | Status: AC

## 2024-10-31 MED ORDER — FUROSEMIDE 40 MG PO TABS
40.0000 mg | ORAL_TABLET | Freq: Two times a day (BID) | ORAL | Status: DC
  Administered 2024-10-31: 40 mg via ORAL
  Filled 2024-10-31: qty 1

## 2024-10-31 MED ORDER — POTASSIUM CHLORIDE CRYS ER 20 MEQ PO TBCR
40.0000 meq | EXTENDED_RELEASE_TABLET | ORAL | Status: AC
  Administered 2024-10-31 (×2): 40 meq via ORAL
  Filled 2024-10-31 (×2): qty 2

## 2024-10-31 MED ORDER — METOPROLOL SUCCINATE ER 25 MG PO TB24
25.0000 mg | ORAL_TABLET | Freq: Once | ORAL | Status: AC
  Administered 2024-10-31: 25 mg via ORAL
  Filled 2024-10-31: qty 1

## 2024-10-31 NOTE — TOC CM/SW Note (Signed)
 Transition of Care [facility]) - Inpatient Brief Assessment   Patient Details  Name: [name] MRN: [id number] Date of Birth: 1951-09-26  [facility]) CM/SW Contact:    Waddell Barnie Rama, RN Phone Number: 10/31/2024, 11:12 AM   Clinical Narrative: From home with spouse, has PCP and insurance on file, states has no HH services in place at this time or DME at home.  States family member (daughter)  will transport them home at [employer] and family is support system, states gets medications from [facility] in [place].  Pta self ambulatory.   There are no ICM needs identified  at this time.  Please place consult for ICM needs.     Transition of Care Asessment: Insurance and Status: Insurance coverage has been reviewed Patient has primary care physician: Yes Home environment has been reviewed: home with spouse Prior level of function:: indep Prior/Current Home Services: No current home services Social Drivers of Health Review: SDOH reviewed no interventions necessary Readmission risk has been reviewed: Yes Transition of care needs: no transition of care needs at this time

## 2024-10-31 NOTE — Discharge Summary (Signed)
 Physician Discharge Summary   Patient: [name] MRN: [id number] DOB: 11-28-1951  Admit date:     10/28/2024  Discharge date: 10/31/2024  Discharge Physician: Yetta Blanch  PCP: Cook, Jayce G, DO  Recommendations at discharge: Follow-up with PCP in 1 week with CBC and BMP. Follow-up with cardiology as already scheduled. INR checkup recommended on Thursday. Referral placed for sleep apnea evaluation.   Follow-up Information     Cook, Jayce G, DO. Schedule an appointment as soon as possible for a visit on 11/02/2024.   Specialty: Family Medicine Why: with BMP lab to look at kidney/electrolyte numbers 2:50  for hospital follow up Contact information: [address] [place] [address] [phone]         [facility] [place]. Schedule an appointment as soon as possible for a visit on 11/03/2024.   Specialty: Cardiology Why: for INR check up. Contact information: [address] [place]  [address] [phone]               Hospital Course: Patient with PMH of MS, PAF on warfarin, trigeminal neuralgia on carbamazepine , chronic pain, obesity presents to the hospital with complaints of shortness of breath and leg swelling.   Assessment and Plan: Acute on chronic diastolic CHF. Patient is with leg swelling, shortness of breath orthopnea and PND. Echocardiogram shows EF more than 75% with hyperdynamic function. Unremarkable valvular disease. No wall motion abnormality. Started on IV Lasix.  Responding well. Switching to oral Lasix upon discharge. Using it more as needed.   Hyperdynamic LV. Patient is on Toprol -XL already 75 mg twice daily Due to elevated heart rate, dose increased to 100 mg twice daily. Close follow-up with cardiology recommended outpatient.   Paroxysmal A-fib. Heart rate in 100-110. On Toprol -XL-metoprolol  75 mg twice daily. Increasing to Toprol -XL 100 mg twice daily. On warfarin since she is on  Tegretol  for trigeminal neuralgia due to interaction with DOAC's. INR was subtherapeutic upon admission.  Now is therapeutic. Recommend to continue home regimen with close follow-up for INR checkup on Thursday. May benefit from outpatient conversation about DCCV once INR remains therapeutic for 3 to 4 weeks.  INR has been subtherapeutic in October and November. Refer to A-fib clinic.   Trigeminal neuralgia. Chronic pain syndrome. On tramadol, Lyrica , Norco.  Continue.   Nausea. Constipation Patient reports that she had some nausea with Lasix in the past and even with last dose on 11/29. Currently no nausea. Likely related to constipation. Will treat constipation with Senokot.   HLD. On statin. Continue the same.   Morbid obesity. Body mass index is 39.43 kg/m.  Placing the patient at a high risk for poor outcome. Outpatient sleep study recommended.   History of multiple sclerosis. Outpatient follow-up with neurology as already scheduled.   Consultants:  None  Procedures performed:  Echocardiogram  DISCHARGE MEDICATION: Allergies as of 10/31/2024       Reactions   Other Nausea And Vomiting   All anti inflammatories        Medication List     STOP taking these medications    diphenhydrAMINE 25 MG tablet Commonly known as: BENADRYL       TAKE these medications    Blood Pressure Monitor Devi 1 each by Does not apply route daily.   carbamazepine  200 MG tablet Commonly known as: TEGRETOL  TAKE 1 TABLET BY MOUTH IN THE MORNING AND 1 AT BEDTIME   docusate sodium 100 MG capsule Commonly known as:  Colace Take 1 capsule (100 mg total) by mouth 2 (two) times daily.   Dulcolax 5 MG EC tablet Generic drug: bisacodyl Take 1 tablet (5 mg total) by mouth daily as needed for moderate constipation.   furosemide  40 MG tablet Commonly known as: LASIX  Take 1 tablet (40 mg total) by mouth daily as needed (for weight gain of 3 lbs in 1 day or 5 lbs in 1 week).    HYDROcodone -acetaminophen  10-325 MG tablet Commonly known as: NORCO Take 1 tablet by mouth every 6 (six) hours as needed. Max 4 per day What changed:  when to take this additional instructions   metoprolol  succinate 50 MG 24 hr tablet Commonly known as: Toprol  XL Take 2 tablets (100 mg total) by mouth in the morning and at bedtime. Take with or immediately following a meal. What changed: how much to take   naloxone 4 MG/0.1ML Liqd nasal spray kit Commonly known as: NARCAN Place 1 spray into the nose once.   pregabalin  150 MG capsule Commonly known as: LYRICA  Take 1 capsule (150 mg total) by mouth 2 (two) times daily.   rosuvastatin  10 MG tablet Commonly known as: CRESTOR  Take 1 tablet by mouth once daily   warfarin 5 MG tablet Commonly known as: COUMADIN  Take as directed. If you are unsure how to take this medication, talk to your nurse or doctor. Original instructions: 5 mg (5 mg x 1) every Sun, Tue, Thu; 10 mg (5 mg x 2) all other days What changed: additional instructions       Disposition: Home Diet recommendation: Cardiac diet  Discharge Exam: Vitals:   10/31/24 0004 10/31/24 0424 10/31/24 0556 10/31/24 0814  BP: 138/68 111/63  (!) 129/95  Pulse: 95 89  84  Resp:  18  18  Temp: 97.8 F (36.6 C) (!) 97.5 F (36.4 C)    TempSrc: Oral Oral    SpO2: 98% 95%  99%  Weight:   110.8 kg   Height:   5' 6 (1.676 m)    General: in Mild distress, No Rash Cardiovascular: S1 and S2 Present, No Murmur Respiratory: Good respiratory effort, Bilateral Air entry present. No Crackles, No wheezes Abdomen: Bowel Sound present, No tenderness Extremities: No edema Neuro: Alert and oriented x3, no new focal deficit  Filed Weights   10/28/24 2200 10/30/24 0655 10/31/24 0556  Weight: 113.6 kg 110.8 kg 110.8 kg   Condition at discharge: stable  The results of significant diagnostics from this hospitalization (including imaging, microbiology, ancillary and laboratory) are  listed below for reference.   Imaging Studies: ECHOCARDIOGRAM COMPLETE Result Date: 10/29/2024    ECHOCARDIOGRAM REPORT   Patient Name:   [name] Date of Exam: 10/29/2024 Medical Rec #:  [id number]      Height:       66.0 in Accession #:    [id number]     Weight:       250.4 lb Date of Birth:  May 06, 1951      BSA:          2.200 m Patient Age:    73 years       BP:           143/63 mmHg Patient Gender: F              HR:           95 bpm. Exam Location:  Inpatient Procedure: 2D Echo (Both Spectral and Color Flow Doppler were utilized during  procedure). Indications:    acute diastolic chf  History:        Patient has no prior history of Echocardiogram examinations.                 CAD, history of cancer, Arrythmias:Atrial Fibrillation,                 Signs/Symptoms:Shortness of Breath; Risk Factors:Hypertension                 and Dyslipidemia.  Sonographer:    Tinnie Barefoot RDCS Referring Phys: [id number] JUSTIN B HOWERTER IMPRESSIONS  1. Left ventricular ejection fraction, by estimation, is >75%. The left ventricle has hyperdynamic function. The left ventricle has no regional wall motion abnormalities. There is mild left ventricular hypertrophy. Left ventricular diastolic function could not be evaluated.  2. Right ventricular systolic function is low normal. The right ventricular size is normal. There is mildly elevated pulmonary artery systolic pressure. The estimated right ventricular systolic pressure is 40.0 mmHg.  3. The mitral valve is grossly normal. Trivial mitral valve regurgitation.  4. The aortic valve was not well visualized. Aortic valve regurgitation is not visualized.  5. The inferior vena cava is normal in size with greater than 50% respiratory variability, suggesting right atrial pressure of 3 mmHg. Comparison(s): No prior Echocardiogram. FINDINGS  Left Ventricle: Left ventricular ejection fraction, by estimation, is >75%. The left ventricle has hyperdynamic function. The  left ventricle has no regional wall motion abnormalities. The left ventricular internal cavity size was normal in size. There is mild left ventricular hypertrophy. Left ventricular diastolic function could not be evaluated due to atrial fibrillation. Left ventricular diastolic function could not be evaluated. Right Ventricle: The right ventricular size is normal. No increase in right ventricular wall thickness. Right ventricular systolic function is low normal. There is mildly elevated pulmonary artery systolic pressure. The tricuspid regurgitant velocity is 3.04 m/s, and with an assumed right atrial pressure of 3 mmHg, the estimated right ventricular systolic pressure is 40.0 mmHg. Left Atrium: Left atrial size was normal in size. Right Atrium: Right atrial size was normal in size. Pericardium: There is no evidence of pericardial effusion. Mitral Valve: The mitral valve is grossly normal. Trivial mitral valve regurgitation. Tricuspid Valve: The tricuspid valve is grossly normal. Tricuspid valve regurgitation is trivial. Aortic Valve: The aortic valve was not well visualized. Aortic valve regurgitation is not visualized. Pulmonic Valve: The pulmonic valve was normal in structure. Pulmonic valve regurgitation is not visualized. Aorta: The aortic root and ascending aorta are structurally normal, with no evidence of dilitation. Venous: The inferior vena cava is normal in size with greater than 50% respiratory variability, suggesting right atrial pressure of 3 mmHg. IAS/Shunts: No atrial level shunt detected by color flow Doppler.  LEFT VENTRICLE PLAX 2D LVIDd:         4.80 cm LVIDs:         2.60 cm LV PW:         1.20 cm LV IVS:        1.20 cm LVOT diam:     2.10 cm LV SV:         61 LV SV Index:   28 LVOT Area:     3.46 cm LV IVRT:       63 msec  RIGHT VENTRICLE          IVC RV Basal diam:  2.70 cm  IVC diam: 1.90 cm LEFT ATRIUM  Index        RIGHT ATRIUM           Index LA diam:      4.00 cm 1.82 cm/m   RA  Area:     15.00 cm LA Vol (A2C): 57.0 ml 25.91 ml/m  RA Volume:   39.60 ml  18.00 ml/m LA Vol (A4C): 58.4 ml 26.54 ml/m  AORTIC VALVE LVOT Vmax:   93.23 cm/s LVOT Vmean:  61.833 cm/s LVOT VTI:    0.175 m  AORTA Ao Root diam: 3.20 cm Ao Asc diam:  3.40 cm TRICUSPID VALVE TR Peak grad:   37.0 mmHg TR Vmax:        304.00 cm/s  SHUNTS Systemic VTI:  0.18 m Systemic Diam: 2.10 cm Vinie Maxcy MD Electronically signed by Vinie Maxcy MD Signature Date/Time: 10/29/2024/12:40:10 PM    Final    DG Chest 2 View Result Date: 10/28/2024 EXAM: 2 VIEW(S) XRAY OF THE CHEST 10/28/2024 02:03:00 PM COMPARISON: None available. CLINICAL HISTORY: SOB FINDINGS: LUNGS AND PLEURA: No focal pulmonary opacity. No pleural effusion. No pneumothorax. HEART AND MEDIASTINUM: Aortic atherosclerosis is present. The cardiac silhouette is unremarkable. BONES AND SOFT TISSUES: No acute osseous abnormality. IMPRESSION: 1. No acute cardiopulmonary process. 2. Aortic atherosclerosis Electronically signed by: Norleen Boxer MD 10/28/2024 02:30 PM EST RP Workstation: [id number]    Microbiology: No results found for this or any previous visit. Labs: CBC: Recent Labs  Lab 10/28/24 1343 10/29/24 0252  WBC 6.3 7.1  NEUTROABS  --  4.4  HGB 12.2 12.1  HCT 38.9 38.0  MCV 91.7 90.0  PLT 174 150   Basic Metabolic Panel: Recent Labs  Lab 10/28/24 1343 10/28/24 2144 10/29/24 0252 10/30/24 0211 10/31/24 0211  NA 141  --  145 141 142  K 4.1  --  3.3* 3.6 3.4*  CL 108  --  108 104 102  CO2 24  --  27 28 29   GLUCOSE 93  --  130* 107* 99  BUN 16  --  13 17 23   CREATININE 0.64  --  0.72 0.87 0.92  CALCIUM  8.8*  --  8.8* 8.8* 8.6*  MG  --  2.1 1.8  --   --   PHOS  --   --  3.8  --   --    Liver Function Tests: Recent Labs  Lab 10/29/24 0252  AST 18  ALT 13  ALKPHOS 51  BILITOT 0.4  PROT 6.8  ALBUMIN 3.7   CBG: No results for input(s): GLUCAP in the last 168 hours.  Discharge time spent: 35  minutes  Author: Yetta Blanch, MD  [facility] 10/31/2024

## 2024-10-31 NOTE — Progress Notes (Signed)
 Reviewed AVS, patient expressed understanding of medications, MD follow up reviewed.   Removed IV, Site clean, dry and intact.  Patient states all belongings brought to the hospital at time of admission are accounted for and packed to take home.  Patient informed and expressed understanding where to pick up discharge medications.  [facility] contacted to transport patient to entrance A, where family member was waiting in vehicle to transport home.

## 2024-10-31 NOTE — TOC Transition Note (Signed)
 Transition of Care [facility]) - Discharge Note   Patient Details  Name: [name] MRN: [id number] Date of Birth: 09-09-1951  [facility]) CM/SW Contact:  Waddell Barnie Rama, RN Phone Number: 10/31/2024, 11:14 AM   Clinical Narrative:    For dc today, she has transport home.          Patient Goals and CMS Choice            Discharge Placement                       Discharge Plan and Services Additional resources added to the After Visit Summary for                                       Social Drivers of Health (SDOH) Interventions SDOH Screenings   Food Insecurity: No Food Insecurity (10/28/2024)  Housing: Low Risk  (10/28/2024)  Transportation Needs: No Transportation Needs (10/28/2024)  Utilities: Not At Risk (10/28/2024)  Alcohol Screen: Low Risk  (09/25/2023)  Depression (PHQ2-9): Low Risk  (09/25/2023)  Financial Resource Strain: Low Risk  (09/25/2023)  Physical Activity: Patient Declined (09/25/2023)  Social Connections: Socially Isolated (10/28/2024)  Stress: No Stress Concern Present (09/25/2023)  Tobacco Use: Medium Risk (10/29/2024)  Health Literacy: Adequate Health Literacy (09/25/2023)     Readmission Risk Interventions    10/31/2024   11:12 AM  Readmission Risk Prevention Plan  Post Dischage Appt Complete  Medication Screening Complete  Transportation Screening Complete

## 2024-10-31 NOTE — Progress Notes (Addendum)
 Heart Failure Navigator Progress Note  Assessed for Heart & Vascular TOC clinic readiness.  Patient does not meet criteria due to EF > 75 %, has a scheduled [facility] appointment on 11/22/2024.  No HF TOC. .   Navigator will sign off at this time.   Stephane Haddock, BSN, Scientist, Clinical (histocompatibility And Immunogenetics) Only
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
## 2024-11-01 ENCOUNTER — Telehealth: Payer: Self-pay | Admitting: *Deleted

## 2024-11-01 ENCOUNTER — Ambulatory Visit

## 2024-11-01 NOTE — Transitions of Care (Post Inpatient/ED Visit) (Signed)
   11/01/2024  Name: CHERICA HEIDEN MRN: [id number] DOB: 01-14-1951  Today's TOC FU Call Status: Today's TOC FU Call Status:: Unsuccessful Call (1st Attempt) Unsuccessful Call (1st Attempt) Date: 11/01/24  Attempted to reach the patient regarding the most recent Inpatient/ED visit.  Follow Up Plan: Additional outreach attempts will be made to reach the patient to complete the Transitions of Care (Post Inpatient/ED visit) call.   Cathlean Headland BSN RN [facility] Health Care Management Coordinator [email or web address] Direct Dial: [phone]  Fax: [phone] Website: [email or web address]

## 2024-11-02 ENCOUNTER — Inpatient Hospital Stay: Payer: Self-pay | Admitting: Family Medicine

## 2024-11-02 ENCOUNTER — Telehealth: Payer: Self-pay

## 2024-11-02 NOTE — Transitions of Care (Post Inpatient/ED Visit) (Signed)
   11/02/2024  Name: [name] MRN: [id number] DOB: [birth year]  Today's TOC FU Call Status: Today's TOC FU Call Status:: Unsuccessful Call (2nd Attempt) Unsuccessful Call (2nd Attempt) Date: 11/02/24  Attempted to reach the patient regarding the most recent Inpatient/ED visit.  Follow Up Plan: Additional outreach attempts will be made to reach the patient to complete the Transitions of Care (Post Inpatient/ED visit) call.   Alan Ee, RN, BSN, CEN [employer]- Transition of Care Team.  [facility] [phone]

## 2024-11-03 ENCOUNTER — Inpatient Hospital Stay: Payer: Self-pay | Admitting: Family Medicine

## 2024-11-03 ENCOUNTER — Telehealth: Payer: Self-pay | Admitting: *Deleted

## 2024-11-03 NOTE — Transitions of Care (Post Inpatient/ED Visit) (Signed)
 11/03/2024  Name: [name] MRN: [id number] DOB: Apr 17, 1951  Today's TOC FU Call Status: Today's TOC FU Call Status:: Successful TOC FU Call Completed TOC FU Call Complete Date: 11/03/24  Patient's Name and Date of Birth confirmed. Name, DOB  Transition Care Management Follow-up Telephone Call Date of Discharge: 10/31/24 Discharge Facility: [facility]) Type of Discharge: Inpatient Admission Primary Inpatient Discharge Diagnosis:: Acute diastolic heart failure How have you been since you were released from the hospital?:  (eating, drinking well, ambulating without difficulty, no issues with bowel/ bladder, weighing daily) Any questions or concerns?: No  Items Reviewed: Did you receive and understand the discharge instructions provided?: Yes Medications obtained,verified, and reconciled?: Yes (Medications Reviewed) Any new allergies since your discharge?: No Dietary orders reviewed?: Yes Type of Diet Ordered:: cardiac Do you have support at home?: Yes People in Home [RPT]: spouse Name of Support/Comfort Primary Source: spouse Reviewed HF action plan, importance of continuing daily weights  Medications Reviewed Today: Medications Reviewed Today     Reviewed by Aura Mliss LABOR, RN (Registered Nurse) on 11/03/24 at 1534  Med List Status: <None>   Medication Order Taking? Sig Documenting Provider Last Dose Status Informant  bisacodyl (DULCOLAX) 5 MG EC tablet [id number] Yes Take 1 tablet (5 mg total) by mouth daily as needed for moderate constipation. Tobie Yetta HERO, MD  Active   Blood Pressure Monitor DEVI [id number] Yes 1 each by Does not apply route daily. Miriam Norris, NP  Active Self  carbamazepine  (TEGRETOL ) 200 MG tablet [id number] Yes TAKE 1 TABLET BY MOUTH IN THE MORNING AND 1 AT BEDTIME Cook, Jayce G, DO  Active Self  docusate sodium (COLACE) 100 MG capsule [id number] Yes Take 1 capsule (100 mg total) by mouth 2 (two) times daily. Tobie Yetta HERO, MD  Active    furosemide (LASIX) 40 MG tablet [id number] Yes Take 1 tablet (40 mg total) by mouth daily as needed (for weight gain of 3 lbs in 1 day or 5 lbs in 1 week). Tobie Yetta HERO, MD  Active   HYDROcodone -acetaminophen  [facility]) 10-325 MG tablet [id number] Yes Take 1 tablet by mouth every 6 (six) hours as needed. Max 4 per day Hoskins, Carolyn C, NP  Active Self  metoprolol  succinate (TOPROL  XL) 50 MG 24 hr tablet [id number] Yes Take 2 tablets (100 mg total) by mouth in the morning and at bedtime. Take with or immediately following a meal. Tobie Yetta HERO, MD  Active   naloxone [facility]) nasal spray 4 mg/0.1 mL [id number] Yes Place 1 spray into the nose once. [provider]  Active Self  pregabalin  (LYRICA ) 150 MG capsule [id number]  Take 1 capsule (150 mg total) by mouth 2 (two) times daily.  Patient not taking: Reported on 11/03/2024   Cook, Jayce G, DO  Active Self  rosuvastatin  (CRESTOR ) 10 MG tablet [id number]  Take 1 tablet by mouth once daily  Patient not taking: Reported on 11/03/2024   Miriam Norris, NP  Active Self  warfarin (COUMADIN ) 5 MG tablet [id number] Yes 5 mg (5 mg x 1) every Sun, Tue, Thu; 10 mg (5 mg x 2) all other days Tobie Yetta HERO, MD  Active             Home Care and Equipment/Supplies: Were Home Health Services Ordered?: No Any new equipment or medical supplies ordered?: No  Functional Questionnaire: Do you need assistance with bathing/showering or dressing?: No Do you need assistance with meal preparation?: No Do you need assistance with  eating?: No Do you have difficulty maintaining continence: No Do you need assistance with getting out of bed/getting out of a chair/moving?: No Do you have difficulty managing or taking your medications?: No  Follow up appointments reviewed: PCP Follow-up appointment confirmed?: Yes Date of PCP follow-up appointment?: 11/09/24 Follow-up Provider: Jayce Cook DO Specialist [facility] Follow-up appointment confirmed?: Yes Date of  Specialist follow-up appointment?: 11/25/24 Follow-Up Specialty Provider:: cardiologist [name] Do you need transportation to your follow-up appointment?: No Do you understand care options if your condition(s) worsen?: Yes-patient verbalized understanding  SDOH Interventions Today    Flowsheet Row Most Recent Value  SDOH Interventions   Food Insecurity Interventions Intervention Not Indicated  Housing Interventions Intervention Not Indicated  Transportation Interventions Intervention Not Indicated  Utilities Interventions Intervention Not Indicated    Mliss Creed [facility], BSN RN Care Manager/ Transition of Care [place] Population [facility] [phone]

## 2024-11-07 ENCOUNTER — Ambulatory Visit
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
## 2024-11-08 ENCOUNTER — Ambulatory Visit: Payer: Medicare Other | Admitting: Neurology

## 2024-11-08 ENCOUNTER — Ambulatory Visit: Attending: Cardiovascular Disease

## 2024-11-08 DIAGNOSIS — Z5181 Encounter for therapeutic drug level monitoring: Secondary | ICD-10-CM

## 2024-11-08 DIAGNOSIS — I480 Paroxysmal atrial fibrillation: Secondary | ICD-10-CM | POA: Diagnosis not present

## 2024-11-08 LAB — POCT INR: INR: 3.5 — AB (ref 2.0–3.0)

## 2024-11-08 NOTE — Patient Instructions (Signed)
 No DOAC at this time.  On tegratol and lyrica  Hold warfarin tonight then resume 2 tablets daily except 1 tablet on Sundays, Tuesdays and Thursdays  Recheck in 3 wks

## 2024-11-08 NOTE — Progress Notes (Signed)
 INR 3.5; Please see anticoagulation encounter

## 2024-11-09 ENCOUNTER — Ambulatory Visit: Admitting: Family Medicine

## 2024-11-09 VITALS — BP 139/79 | HR 87 | Temp 98.7°F | Ht 66.0 in | Wt 248.0 lb

## 2024-11-09 DIAGNOSIS — I5031 Acute diastolic (congestive) heart failure: Secondary | ICD-10-CM

## 2024-11-09 DIAGNOSIS — E876 Hypokalemia: Secondary | ICD-10-CM

## 2024-11-09 DIAGNOSIS — Z6841 Body Mass Index (BMI) 40.0 and over, adult: Secondary | ICD-10-CM | POA: Diagnosis not present

## 2024-11-09 DIAGNOSIS — G500 Trigeminal neuralgia: Secondary | ICD-10-CM

## 2024-11-09 MED ORDER — CARBAMAZEPINE 200 MG PO TABS: ORAL_TABLET | ORAL | 1 refills | Status: AC

## 2024-11-09 NOTE — Assessment & Plan Note (Signed)
 Improved.  As needed Lasix .  Metabolic panel today to reassess potassium.

## 2024-11-09 NOTE — Progress Notes (Signed)
 Subjective:  Patient ID: [name], female    DOB: January 23, 1951  Age: 73 y.o. MRN: [id number]  CC:   Chief Complaint  Patient presents with   Follow-up    Patient is here for a hospital follow up. She was experiencing swelling in the legs and chest pain and was in the er for 3 days. She states she feels better    HPI:  73 year old female presents for hospital follow-up.  Patient was recently admitted from 11/28 to 12/1.  Labs, imaging, notes, and discharge summary reviewed today.    In summary:  Presented with shortness of breath and leg swelling.  Diagnosed with diastolic heart failure.  Was treated with diuretics and improved.  Patient discharged home on Lasix .  Needs metabolic panel today.    Patient states that she is improved.  Swelling improved.  Denies chest pain.  No significant shortness of breath.  Patient's main complaint today is trigeminal neuralgia.  She states that carbamazepine  does not seem to be working as well as it previously did.  She is currently on 200 mg twice daily.   Patient Active Problem List   Diagnosis Date Noted   Essential hypertension 10/29/2024   Acute diastolic heart failure (HCC) 10/28/2024   Meningioma (HCC) 11/04/2022   Pulmonary nodules 11/04/2022   Preventative health care 07/22/2022   Venous stasis 06/29/2022   Trigeminal neuralgia 06/23/2022   Paroxysmal atrial fibrillation (HCC) 06/23/2022   Multiple sclerosis 06/20/2022   Chronic pain syndrome 06/20/2022   CAD (coronary artery disease) 06/20/2022   History of lung cancer 06/20/2022   Aortic atherosclerosis 06/20/2022   Fibromyalgia affecting multiple sites 11/18/2017   Anxiety 06/28/2016   Osteoporosis 08/07/2015   GERD (gastroesophageal reflux disease) 08/14/2011   HLD (hyperlipidemia) 03/09/2009    Social Hx   Social History   Socioeconomic History   Marital status: Married    Spouse name: Not on file   Number of children: 1   Years of education: Not on file    Highest education level: Not on file  Occupational History   Occupation: self-employed Health And Safety Inspector: SELF-EMPLOYED  Tobacco Use   Smoking status: Former    Current packs/day: 0.50    Types: Cigarettes   Smokeless tobacco: Never   Tobacco comments:    quit yrs ago  Vaping Use   Vaping status: Never Used  Substance and Sexual Activity   Alcohol use: No   Drug use: No   Sexual activity: Not on file  Other Topics Concern   Not on file  Social History Narrative   Left handed   Social Drivers of Health   Financial Resource Strain: Low Risk  (09/25/2023)   Overall Financial Resource Strain (CARDIA)    Difficulty of Paying Living Expenses: Not hard at all  Food Insecurity: No Food Insecurity (11/03/2024)   Hunger Vital Sign    Worried About Running Out of Food in the Last Year: Never true    Ran Out of Food in the Last Year: Never true  Transportation Needs: No Transportation Needs (11/03/2024)   PRAPARE - Administrator, Civil Service (Medical): No    Lack of Transportation (Non-Medical): No  Physical Activity: Patient Declined (09/25/2023)   Exercise Vital Sign    Days of Exercise per Week: Patient declined    Minutes of Exercise per Session: Patient declined  Stress: No Stress Concern Present (09/25/2023)   [employer] of Occupational Health - Occupational Stress Questionnaire  Feeling of Stress : Not at all  Social Connections: Socially Isolated (10/28/2024)   Social Connection and Isolation Panel    Frequency of Communication with Friends and Family: Once a week    Frequency of Social Gatherings with Friends and Family: Once a week    Attends Religious Services: Never    Diplomatic Services Operational Officer: No    Attends Engineer, Structural: Never    Marital Status: Married    Review of Systems Per HPI  Objective:  BP 139/79 (BP Location: Right Arm, Patient Position: Sitting)   Pulse 87   Temp 98.7 F (37.1 C)    Ht 5' 6 (1.676 m)   Wt 248 lb (112.5 kg)   SpO2 98%   BMI 40.03 kg/m      11/09/2024    1:24 PM 11/03/2024    3:38 PM 11/03/2024    3:37 PM  BP/Weight  Systolic BP 139    Diastolic BP 79    Wt. (Lbs) 248 243 243  BMI 40.03 kg/m2 39.22 kg/m2 39.22 kg/m2    Physical Exam Vitals and nursing note reviewed.  Constitutional:      General: She is not in acute distress.    Appearance: She is obese.  HENT:     Head: Normocephalic and atraumatic.  Cardiovascular:     Comments: Irregularly irregular. Pulmonary:     Effort: Pulmonary effort is normal.     Breath sounds: Normal breath sounds. No wheezing or rales.  Neurological:     Mental Status: She is alert.     Lab Results  Component Value Date   WBC 7.1 10/29/2024   HGB 12.1 10/29/2024   HCT 38.0 10/29/2024   PLT 150 10/29/2024   GLUCOSE 99 10/31/2024   CHOL 248 (H) 08/05/2023   TRIG 113 08/05/2023   HDL 56 08/05/2023   LDLCALC 169 (H) 08/05/2023   ALT 13 10/29/2024   AST 18 10/29/2024   NA 142 10/31/2024   K 3.4 (L) 10/31/2024   CL 102 10/31/2024   CREATININE 0.92 10/31/2024   BUN 23 10/31/2024   CO2 29 10/31/2024   TSH 3.130 08/05/2023   INR 3.5 (A) 11/08/2024     Assessment & Plan:  Acute diastolic heart failure (HCC) Assessment & Plan: Improved.  As needed Lasix .  Metabolic panel today to reassess potassium.   Hypokalemia -     Basic metabolic panel with GFR  Trigeminal neuralgia Assessment & Plan: Uncontrolled. Increasing dose of Carbamezapine.   Orders: -     carBAMazepine ; TAKE 2 TABLETS BY MOUTH IN THE MORNING AND 1 AT BEDTIME  Dispense: 270 tablet; Refill: 1    Follow-up:  6 months   Pansie Guggisberg Bluford DO [facility]

## 2024-11-09 NOTE — Assessment & Plan Note (Signed)
 Uncontrolled. Increasing dose of Carbamezapine.

## 2024-11-09 NOTE — Patient Instructions (Signed)
 Tegretol  increased.  Lab ordered.  Follow up in 6 months.

## 2024-11-10 ENCOUNTER — Ambulatory Visit: Payer: Self-pay | Admitting: Family Medicine

## 2024-11-10 LAB — BASIC METABOLIC PANEL WITH GFR
BUN/Creatinine Ratio: 31 — ABNORMAL HIGH (ref 12–28)
BUN: 22 mg/dL (ref 8–27)
CO2: 24 mmol/L (ref 20–29)
Calcium: 9 mg/dL (ref 8.7–10.3)
Chloride: 107 mmol/L — ABNORMAL HIGH (ref 96–106)
Creatinine, Ser: 0.72 mg/dL (ref 0.57–1.00)
Glucose: 90 mg/dL (ref 70–99)
Potassium: 4.6 mmol/L (ref 3.5–5.2)
Sodium: 143 mmol/L (ref 134–144)
eGFR: 88 mL/min/1.73 (ref >59)

## 2024-11-14 ENCOUNTER — Ambulatory Visit

## 2024-11-22 ENCOUNTER — Ambulatory Visit: Admitting: Nurse Practitioner

## 2024-11-25 ENCOUNTER — Ambulatory Visit: Admitting: Nurse Practitioner

## 2024-11-27 ENCOUNTER — Emergency Department (HOSPITAL_COMMUNITY)
Admission: EM | Admit: 2024-11-27 | Discharge: 2024-11-27 | Disposition: A | Discharge status: Home / Self Care | Attending: Emergency Medicine | Admitting: Emergency Medicine

## 2024-11-27 ENCOUNTER — Other Ambulatory Visit: Payer: Self-pay

## 2024-11-27 ENCOUNTER — Emergency Department (HOSPITAL_COMMUNITY)

## 2024-11-27 ENCOUNTER — Encounter (HOSPITAL_COMMUNITY): Payer: Self-pay

## 2024-11-27 DIAGNOSIS — Z7901 Long term (current) use of anticoagulants: Secondary | ICD-10-CM | POA: Diagnosis not present

## 2024-11-27 DIAGNOSIS — U071 COVID-19: Secondary | ICD-10-CM | POA: Diagnosis not present

## 2024-11-27 DIAGNOSIS — R059 Cough, unspecified: Secondary | ICD-10-CM | POA: Diagnosis present

## 2024-11-27 HISTORY — DX: Heart failure, unspecified: I50.9

## 2024-11-27 HISTORY — DX: Unspecified atrial fibrillation: I48.91

## 2024-11-27 LAB — CBC
HCT: 44.5 % (ref 36.0–46.0)
Hemoglobin: 14.2 g/dL (ref 12.0–15.0)
MCH: 28.9 pg (ref 26.0–34.0)
MCHC: 31.9 g/dL (ref 30.0–36.0)
MCV: 90.4 fL (ref 80.0–100.0)
Platelets: 172 K/uL (ref 150–400)
RBC: 4.92 MIL/uL (ref 3.87–5.11)
RDW: 13.8 % (ref 11.5–15.5)
WBC: 5.8 K/uL (ref 4.0–10.5)
nRBC: 0 % (ref 0.0–0.2)

## 2024-11-27 LAB — BASIC METABOLIC PANEL WITH GFR
Anion gap: 18 — ABNORMAL HIGH (ref 5–15)
BUN: 16 mg/dL (ref 8–23)
CO2: 20 mmol/L — ABNORMAL LOW (ref 22–32)
Calcium: 9 mg/dL (ref 8.9–10.3)
Chloride: 104 mmol/L (ref 98–111)
Creatinine, Ser: 0.79 mg/dL (ref 0.44–1.00)
GFR, Estimated: >60 mL/min
Glucose, Bld: 123 mg/dL — ABNORMAL HIGH (ref 70–99)
Potassium: 3.6 mmol/L (ref 3.5–5.1)
Sodium: 142 mmol/L (ref 135–145)

## 2024-11-27 LAB — TROPONIN T, HIGH SENSITIVITY: Troponin T High Sensitivity: 18 ng/L (ref 0–19)

## 2024-11-27 LAB — RESP PANEL BY RT-PCR (RSV, FLU A&B, COVID)  RVPGX2
Influenza A by PCR: NEGATIVE
Influenza B by PCR: NEGATIVE
Resp Syncytial Virus by PCR: NEGATIVE
SARS Coronavirus 2 by RT PCR: POSITIVE — AB

## 2024-11-27 NOTE — ED Triage Notes (Signed)
 Pt presents with feeling flushed, night sweats, cold chills, heart is racing, nauseated, weakness, productive cough, ShOB, decreased appetite. Denies CP. Positive sick contacts.

## 2024-11-27 NOTE — ED Provider Notes (Signed)
 " [place] EMERGENCY DEPARTMENT AT [facility] Provider Note   CSN: [id number] Arrival date & time: 11/27/24  1505     Patient presents with: No chief complaint on file.   [name] is a 73 y.o. female.   HPI Patient has been feeling bad for about a week and a half.  Cough URI symptoms.  Feeling flushed.  Feels her heart racing at time also.  Some nausea and weakness.  Some productive cough.  Her husband has been sick also.  No History of atrial fibrillation.    Prior to Admission medications  Medication Sig Start Date End Date Taking? Authorizing Provider  bisacodyl (DULCOLAX) 5 MG EC tablet Take 1 tablet (5 mg total) by mouth daily as needed for moderate constipation. 10/31/24 10/31/25  Tobie Yetta HERO, MD  Blood Pressure Monitor DEVI 1 each by Does not apply route daily. 06/23/23   Miriam Norris, NP  carbamazepine  (TEGRETOL ) 200 MG tablet TAKE 2 TABLETS BY MOUTH IN THE MORNING AND 1 AT BEDTIME 11/09/24   Cook, Jayce G, DO  docusate sodium  (COLACE) 100 MG capsule Take 1 capsule (100 mg total) by mouth 2 (two) times daily. 10/31/24   Tobie Yetta HERO, MD  furosemide  (LASIX ) 40 MG tablet Take 1 tablet (40 mg total) by mouth daily as needed (for weight gain of 3 lbs in 1 day or 5 lbs in 1 week). 10/31/24   Tobie Yetta HERO, MD  HYDROcodone -acetaminophen  (NORCO) 10-325 MG tablet Take 1 tablet by mouth every 6 (six) hours as needed. Max 4 per day 11/12/17   Hoskins, Carolyn C, NP  metoprolol  succinate (TOPROL  XL) 50 MG 24 hr tablet Take 2 tablets (100 mg total) by mouth in the morning and at bedtime. Take with or immediately following a meal. 10/31/24   Tobie Yetta HERO, MD  naloxone [facility]) nasal spray 4 mg/0.1 mL Place 1 spray into the nose once. 09/05/24   [provider]  pregabalin  (LYRICA ) 150 MG capsule Take 1 capsule (150 mg total) by mouth 2 (two) times daily. 08/08/24   Cook, Jayce G, DO  rosuvastatin  (CRESTOR ) 10 MG tablet Take 1 tablet by mouth once  daily Patient not taking: Reported on 11/09/2024 01/04/24   Miriam Norris, NP  warfarin (COUMADIN ) 5 MG tablet 5 mg (5 mg x 1) every Sun, Tue, Thu; 10 mg (5 mg x 2) all other days 10/31/24   Tobie Yetta HERO, MD    Allergies: Other    Review of Systems  Updated Vital Signs BP 125/65 (BP Location: Left Arm)   Pulse 90   Temp (!) 97.2 F (36.2 C) (Temporal)   Resp 14   Ht 5' 6 (1.676 m)   Wt 104.3 kg   SpO2 95%   BMI 37.12 kg/m   Physical Exam Vitals and nursing note reviewed.  Cardiovascular:     Rate and Rhythm: Normal rate. Rhythm irregular.  Pulmonary:     Breath sounds: No wheezing or rhonchi.  Abdominal:     Tenderness: There is no abdominal tenderness.  Skin:    Capillary Refill: Capillary refill takes less than 2 seconds.  Neurological:     Mental Status: She is alert and oriented to person, place, and time.     (all labs ordered are listed, but only abnormal results are displayed) Labs Reviewed  RESP PANEL BY RT-PCR (RSV, FLU A&B, COVID)  [id number] - Abnormal; Notable for the following components:      Result Value  SARS Coronavirus 2 by RT PCR POSITIVE (*)    All other components within normal limits  BASIC METABOLIC PANEL WITH GFR - Abnormal; Notable for the following components:   CO2 20 (*)    Glucose, Bld 123 (*)    Anion gap 18 (*)    All other components within normal limits  CBC  TROPONIN T, HIGH SENSITIVITY  TROPONIN T, HIGH SENSITIVITY    EKG: EKG Interpretation Date/Time:  Sunday November 27 2024 15:11:41 EST Ventricular Rate:  110 PR Interval:    QRS Duration:  76 QT Interval:  336 QTC Calculation: 454 R Axis:   -29  Text Interpretation: Atrial fibrillation with rapid ventricular response Cannot rule out Anterior infarct , age undetermined Abnormal ECG When compared with ECG of 28-Oct-2024 13:41, Minimal criteria for Anterior infarct are now Present Nonspecific T wave abnormality, worse in Lateral leads QT has lengthened Confirmed by  Patsey Lot [phone]) on 11/27/2024 4:45:48 PM  Radiology: [facility] Chest 2 View Result Date: 11/27/2024 CLINICAL DATA:  Shortness of breath, productive cough, nausea, weakness. EXAM: CHEST - 2 VIEW COMPARISON:  10/28/2024. FINDINGS: The heart is enlarged and the mediastinal contour stable. There is atherosclerotic calcification of the aorta. A small to moderate hiatal hernia is noted. Surgical changes are noted in the mid left lung. No consolidation, effusion, or pneumothorax is seen. No acute osseous abnormality. IMPRESSION: 1. No active cardiopulmonary disease. 2. Small to moderate hiatal hernia. Electronically Signed   By: Leita Birmingham M.D.   On: 11/27/2024 17:11     Procedures   Medications Ordered in the ED - No data to display                                  Medical Decision Making Amount and/or Complexity of Data Reviewed Labs: ordered. Radiology: ordered.   Patient be symptoms cough patient had over a week of symptoms.  Also history of A-fib and feeling heart going fast at times.  However to monitor now heart rate is in the 80s.  Lungs overall clear.  Will get x-ray and basic blood work.  Will ambulate her pulse ox.  X-ray reassuring.  Ambulate with pulse ox without tachycardia or hypoxia.  Discharge home.  Not an antiviral candidate due to over a week of symptoms.  Discharged home     Final diagnoses:  COVID-19    ED Discharge Orders     None          Patsey Lot, MD 11/27/24 1757  "

## 2024-11-27 NOTE — ED Notes (Signed)
 Pt ambulated with pulse ox. Pt stayed above 95% on rm air and HR below 100.

## 2024-11-28 ENCOUNTER — Telehealth: Payer: Self-pay | Admitting: Cardiovascular Disease

## 2024-11-28 ENCOUNTER — Ambulatory Visit: Payer: Self-pay

## 2024-11-28 NOTE — Telephone Encounter (Signed)
 PT canceled coumadin  appt for 11/28/24 because she has covid and did not wish to reschedule it this time

## 2024-11-28 NOTE — Telephone Encounter (Signed)
 Patient's phone was breaking up during call and disconnected. Attempted to call patient back, left voice mail.  Copied from CRM #[id number]. Topic: Clinical - Red Word Triage >> Nov 28, 2024 11:41 AM Lonell PEDLAR wrote: Red Word that prompted transfer to Nurse Triage: Patient recently diagnosed with COVID, unsure what medications she can take, experiencing extreme fatigue and weakness

## 2024-11-28 NOTE — Telephone Encounter (Signed)
 Noted

## 2024-11-28 NOTE — Telephone Encounter (Signed)
 FYI Only or Action Required?: Action required by provider: clinical question for provider.  Patient was last seen in primary care on 11/09/2024 by Cook, Jayce G, DO.  Called Nurse Triage reporting Covid Positive.  Symptoms began several days ago.  Interventions attempted: Rest, hydration, or home remedies.  Symptoms are: stable.  Triage Disposition: Call PCP When Office is Open  Patient/caregiver understands and will follow disposition?: Yes  Reason for Disposition  [1] Caller requesting NON-URGENT health information AND [2] PCP's office is the best resource  Answer Assessment - Initial Assessment Questions Patient denying office visit. She wants to know what OTC medications would be safe for her to take. Please advise, also advised patient to call pharmacist.   1. REASON FOR CALL: What is the main reason for your call? or How can I best help you?     Patient was in ED and tested positive for Covid. She is wondering what medications is safe for her to take due to all her heart medications.   2. SYMPTOMS : Do you have any symptoms?      Denies new or worsening from leaving ED  Protocols used: Information Only Call - No Triage-A-AH

## 2024-11-29 ENCOUNTER — Ambulatory Visit

## 2024-12-07 ENCOUNTER — Ambulatory Visit: Admitting: Neurology

## 2024-12-15 ENCOUNTER — Telehealth: Payer: Self-pay | Admitting: Cardiovascular Disease

## 2024-12-15 NOTE — Telephone Encounter (Signed)
 Pt states she has been too sick and weak to come in for INR check. She ask office if there are any other options or how long before she needs another check. Please advise.

## 2024-12-16 NOTE — Telephone Encounter (Signed)
 Patient is calling back for update. Please advise

## 2024-12-16 NOTE — Telephone Encounter (Signed)
 Left message for patient that Olam Bathe will call her Monday to reschedule her appointment.

## 2024-12-19 ENCOUNTER — Ambulatory Visit: Attending: Cardiovascular Disease | Admitting: *Deleted

## 2024-12-19 DIAGNOSIS — I480 Paroxysmal atrial fibrillation: Secondary | ICD-10-CM | POA: Diagnosis present

## 2024-12-19 DIAGNOSIS — Z5181 Encounter for therapeutic drug level monitoring: Secondary | ICD-10-CM | POA: Diagnosis present

## 2024-12-19 LAB — POCT INR: INR: 2.9 (ref 2.0–3.0)

## 2024-12-19 NOTE — Telephone Encounter (Signed)
 Called pt to inform her she has an INR appt today.  She is aware and plans to be here.

## 2024-12-19 NOTE — Patient Instructions (Signed)
 No DOAC at this time.  On tegratol and lyrica  Continue warfarin 2 tablets daily except 1 tablet on Sundays, Tuesdays and Thursdays  Recheck in 6 wks

## 2024-12-19 NOTE — Progress Notes (Signed)
"  INR - 2.9   "

## 2025-01-02 ENCOUNTER — Ambulatory Visit: Payer: Self-pay

## 2025-01-02 NOTE — Telephone Encounter (Signed)
 Patient called back and states that her blood pressure keeps going up. Patient states that she started having chest pain. Patient states that if she doesn't hear anything back from the office she may end up going to the Emergency Room later.  This RN advised that if she was now having chest pain. She is advised that if she is now having chest pains with her blood pressure going up.  Patient states the pain just went away after this RN advising her that.  She states it may be indigestion.  This RN advised that with her symptoms---the ER is recommended at this time.  She states she was waiting to hear back from the office but at this point she is going to call her daughter and talk to her about this. She is advised to call us  back with any further questions/concerns/changes and if anything gets worse to call 911. Patient verbalized understanding.

## 2025-01-02 NOTE — Telephone Encounter (Signed)
 FYI Only or Action Required?: Action required by provider: update on patient condition and requesting medication .  Patient was last seen in primary care on 11/09/2024 by Cook, Jayce G, DO.  Called Nurse Triage reporting No chief complaint on file..  Symptoms began several days ago.  Interventions attempted: Rest, hydration, or home remedies.  Symptoms are: gradually worsening.  Triage Disposition: See Physician Within 24 Hours  Patient/caregiver understands and will follow disposition?: No, wishes to speak with PCP   Message from [place] R sent at 01/02/2025 11:35 AM EST  Reason for Triage: Patient's dog passed away last week and she states she cannot deal with it, her anxiety and BP is high 157/108, heart rate at 93 and she needs to see if there is anything she can help to keep both of them down.   Reason for Disposition  [1] Depression AND [2] getting worse (e.g., sleeping poorly, less able to do activities of daily living)  Answer Assessment - Initial Assessment Questions Lost her dog over the weekend- had her for 17years. Lives with husband and another puppy but feels so alone. Even harder to get out of bed than normal. Occasional nausea from anxiety. Occasional CP from anxieity.   BP and HR elevated 160/109 currently- denies current CP, or nausea. No missed medication.  Anxiety- sometimes causing sharp pains in her chest-  Xanax - stopped  Valium  for MRI  Unable to do Video visit- asking if medication can be sent in to help with her situational anxiety depression. Has previously been on Xanax  and has taken Valium  for MRI in the past.   1. CONCERN: What happened that made you call today?     Increased anxiety/depression  2. DEPRESSION SYMPTOM SCREENING: How are you feeling overall? (e.g., decreased energy, increased sleeping or difficulty sleeping, difficulty concentrating, feelings of sadness, guilt, hopelessness, or worthlessness)     Hard to get out of bed  3. RISK OF  HARM - SUICIDAL IDEATION:  Do you ever have thoughts of hurting or killing yourself?  (e.g., yes, no, no but preoccupation with thoughts about death)     Denies  4. RISK OF HARM - HOMICIDAL IDEATION:  Do you ever have thoughts of hurting or killing someone else?  (e.g., yes, no, no but preoccupation with thoughts about death)     denies 5. FUNCTIONAL IMPAIRMENT: How have things been going for you overall? Have you had more difficulty than usual doing your normal daily activities?  (e.g., better, same, worse; self-care, school, work, interactions)     Not able to get up to do a whole lot at baseline .  6. SUPPORT: Who is with you now? Who do you live with? Do you have family or friends who you can talk to?      Talks with her daughter  9. STRESSORS: Has there been any new stress or recent changes in your life?     Her dog passed away  9. ALCOHOL USE OR SUBSTANCE USE (DRUG USE): Do you drink alcohol or use any illegal drugs?     Denies  10. OTHER: Do you have any other physical symptoms right now? (e.g., fever)       Increased BP and HR, nausea intermittent  Protocols used: Depression-A-AH

## 2025-01-03 ENCOUNTER — Ambulatory Visit: Admitting: Family Medicine

## 2025-01-03 ENCOUNTER — Ambulatory Visit: Payer: Self-pay

## 2025-01-03 VITALS — BP 123/88 | HR 73 | Temp 98.6°F

## 2025-01-03 DIAGNOSIS — F4321 Adjustment disorder with depressed mood: Secondary | ICD-10-CM | POA: Insufficient documentation

## 2025-01-03 MED ORDER — HYDROXYZINE PAMOATE 25 MG PO CAPS: 25.0000 mg | ORAL_CAPSULE | Freq: Three times a day (TID) | ORAL | 3 refills | Status: AC | PRN

## 2025-01-03 NOTE — Telephone Encounter (Signed)
 This encounter was created in error - please disregard.

## 2025-01-03 NOTE — Telephone Encounter (Signed)
 Pt states she feels the same as yesterday. Did not go to ED yesterday. Requesting appt. Virtual appt made.

## 2025-01-11 ENCOUNTER — Ambulatory Visit: Admitting: Neurology

## 2025-01-18 ENCOUNTER — Ambulatory Visit: Admitting: Student

## 2025-01-30 ENCOUNTER — Ambulatory Visit
